# Patient Record
Sex: Female | Born: 1970 | Race: White | Hispanic: No | Marital: Married | Smoking: Former smoker
Health system: Southern US, Community
[De-identification: ages and names within clinical notes are randomized; demographics above are authoritative.]

## PROBLEM LIST (undated history)

## (undated) DIAGNOSIS — E119 Type 2 diabetes mellitus without complications: Secondary | ICD-10-CM

## (undated) DIAGNOSIS — D0511 Intraductal carcinoma in situ of right breast: Secondary | ICD-10-CM

## (undated) DIAGNOSIS — I10 Essential (primary) hypertension: Secondary | ICD-10-CM

## (undated) DIAGNOSIS — G5603 Carpal tunnel syndrome, bilateral upper limbs: Secondary | ICD-10-CM

## (undated) DIAGNOSIS — N200 Calculus of kidney: Secondary | ICD-10-CM

## (undated) DIAGNOSIS — F419 Anxiety disorder, unspecified: Secondary | ICD-10-CM

## (undated) DIAGNOSIS — R202 Paresthesia of skin: Secondary | ICD-10-CM

## (undated) DIAGNOSIS — H6120 Impacted cerumen, unspecified ear: Secondary | ICD-10-CM

## (undated) DIAGNOSIS — R7303 Prediabetes: Secondary | ICD-10-CM

## (undated) DIAGNOSIS — N189 Chronic kidney disease, unspecified: Secondary | ICD-10-CM

## (undated) DIAGNOSIS — E538 Deficiency of other specified B group vitamins: Secondary | ICD-10-CM

## (undated) DIAGNOSIS — L301 Dyshidrosis [pompholyx]: Secondary | ICD-10-CM

## (undated) DIAGNOSIS — E785 Hyperlipidemia, unspecified: Secondary | ICD-10-CM

## (undated) DIAGNOSIS — Z87442 Personal history of urinary calculi: Secondary | ICD-10-CM

## (undated) DIAGNOSIS — E669 Obesity, unspecified: Secondary | ICD-10-CM

## (undated) DIAGNOSIS — N2889 Other specified disorders of kidney and ureter: Secondary | ICD-10-CM

## (undated) HISTORY — DX: Carpal tunnel syndrome, bilateral upper limbs: G56.03

## (undated) HISTORY — DX: Prediabetes: R73.03

## (undated) HISTORY — DX: Dyshidrosis (pompholyx): L30.1

## (undated) HISTORY — PX: KIDNEY STONE SURGERY: SHX686

## (undated) HISTORY — DX: Impacted cerumen, unspecified ear: H61.20

## (undated) HISTORY — DX: Deficiency of other specified B group vitamins: E53.8

## (undated) HISTORY — DX: Paresthesia of skin: R20.2

---

## 2010-04-15 ENCOUNTER — Ambulatory Visit: Payer: Self-pay

## 2010-04-27 ENCOUNTER — Ambulatory Visit: Payer: Self-pay

## 2012-10-30 DIAGNOSIS — R7303 Prediabetes: Secondary | ICD-10-CM

## 2012-10-30 HISTORY — DX: Prediabetes: R73.03

## 2012-11-15 DIAGNOSIS — I10 Essential (primary) hypertension: Secondary | ICD-10-CM | POA: Insufficient documentation

## 2012-11-15 DIAGNOSIS — H6120 Impacted cerumen, unspecified ear: Secondary | ICD-10-CM

## 2012-11-15 HISTORY — DX: Impacted cerumen, unspecified ear: H61.20

## 2013-05-23 DIAGNOSIS — R300 Dysuria: Secondary | ICD-10-CM | POA: Insufficient documentation

## 2013-05-23 DIAGNOSIS — R399 Unspecified symptoms and signs involving the genitourinary system: Secondary | ICD-10-CM | POA: Insufficient documentation

## 2014-04-19 DIAGNOSIS — G5603 Carpal tunnel syndrome, bilateral upper limbs: Secondary | ICD-10-CM

## 2014-04-19 DIAGNOSIS — L301 Dyshidrosis [pompholyx]: Secondary | ICD-10-CM

## 2014-04-19 DIAGNOSIS — R202 Paresthesia of skin: Secondary | ICD-10-CM | POA: Insufficient documentation

## 2014-04-19 HISTORY — DX: Carpal tunnel syndrome, bilateral upper limbs: G56.03

## 2014-04-19 HISTORY — DX: Paresthesia of skin: R20.2

## 2014-04-19 HISTORY — DX: Dyshidrosis (pompholyx): L30.1

## 2014-04-22 DIAGNOSIS — E538 Deficiency of other specified B group vitamins: Secondary | ICD-10-CM | POA: Insufficient documentation

## 2014-04-22 HISTORY — DX: Deficiency of other specified B group vitamins: E53.8

## 2014-11-22 DIAGNOSIS — E7800 Pure hypercholesterolemia, unspecified: Secondary | ICD-10-CM | POA: Insufficient documentation

## 2015-12-28 DIAGNOSIS — N201 Calculus of ureter: Secondary | ICD-10-CM | POA: Insufficient documentation

## 2015-12-30 DIAGNOSIS — Z6841 Body Mass Index (BMI) 40.0 and over, adult: Secondary | ICD-10-CM

## 2016-02-25 ENCOUNTER — Other Ambulatory Visit: Payer: Self-pay | Admitting: Obstetrics and Gynecology

## 2016-02-25 DIAGNOSIS — Z1231 Encounter for screening mammogram for malignant neoplasm of breast: Secondary | ICD-10-CM

## 2016-03-18 ENCOUNTER — Encounter: Payer: Self-pay | Admitting: Radiology

## 2016-03-18 ENCOUNTER — Ambulatory Visit
Admission: RE | Admit: 2016-03-18 | Discharge: 2016-03-18 | Disposition: A | Discharge status: Home / Self Care | Payer: Managed Care, Other (non HMO) | Source: Ambulatory Visit | Attending: Obstetrics and Gynecology | Admitting: Obstetrics and Gynecology

## 2016-03-18 DIAGNOSIS — Z1231 Encounter for screening mammogram for malignant neoplasm of breast: Secondary | ICD-10-CM | POA: Insufficient documentation

## 2016-04-06 ENCOUNTER — Encounter: Payer: Self-pay | Admitting: Obstetrics and Gynecology

## 2016-04-09 NOTE — Progress Notes (Signed)
Letter mailed

## 2016-11-23 ENCOUNTER — Other Ambulatory Visit: Payer: Self-pay

## 2016-11-23 ENCOUNTER — Inpatient Hospital Stay
Admission: EM | Admit: 2016-11-23 | Discharge: 2016-11-25 | DRG: 339 | Disposition: A | Discharge status: Home / Self Care | Payer: 59 | Attending: Surgery | Admitting: Surgery

## 2016-11-23 ENCOUNTER — Emergency Department: Payer: 59

## 2016-11-23 ENCOUNTER — Emergency Department: Payer: 59 | Admitting: Certified Registered Nurse Anesthetist

## 2016-11-23 ENCOUNTER — Encounter
Admission: EM | Disposition: A | Discharge status: Home / Self Care | Payer: Self-pay | Source: Home / Self Care | Attending: Surgery

## 2016-11-23 ENCOUNTER — Encounter: Payer: Self-pay | Admitting: Emergency Medicine

## 2016-11-23 DIAGNOSIS — Z6839 Body mass index (BMI) 39.0-39.9, adult: Secondary | ICD-10-CM | POA: Diagnosis not present

## 2016-11-23 DIAGNOSIS — Z882 Allergy status to sulfonamides status: Secondary | ICD-10-CM

## 2016-11-23 DIAGNOSIS — K3532 Acute appendicitis with perforation and localized peritonitis, without abscess: Secondary | ICD-10-CM

## 2016-11-23 DIAGNOSIS — I10 Essential (primary) hypertension: Secondary | ICD-10-CM | POA: Diagnosis present

## 2016-11-23 DIAGNOSIS — N3001 Acute cystitis with hematuria: Secondary | ICD-10-CM | POA: Diagnosis present

## 2016-11-23 DIAGNOSIS — K353 Acute appendicitis with localized peritonitis, without perforation or gangrene: Secondary | ICD-10-CM

## 2016-11-23 DIAGNOSIS — Z87442 Personal history of urinary calculi: Secondary | ICD-10-CM

## 2016-11-23 HISTORY — PX: LAPAROSCOPIC APPENDECTOMY: SHX408

## 2016-11-23 HISTORY — DX: Calculus of kidney: N20.0

## 2016-11-23 HISTORY — DX: Essential (primary) hypertension: I10

## 2016-11-23 HISTORY — DX: Acute appendicitis with perforation, localized peritonitis, and gangrene, without abscess: K35.32

## 2016-11-23 HISTORY — PX: APPENDECTOMY: SHX54

## 2016-11-23 LAB — URINALYSIS, COMPLETE (UACMP) WITH MICROSCOPIC
Bilirubin Urine: NEGATIVE
GLUCOSE, UA: NEGATIVE mg/dL
KETONES UR: 5 mg/dL — AB
Nitrite: POSITIVE — AB
PH: 5 (ref 5.0–8.0)
Protein, ur: 30 mg/dL — AB
SPECIFIC GRAVITY, URINE: 1.019 (ref 1.005–1.030)

## 2016-11-23 LAB — COMPREHENSIVE METABOLIC PANEL
ALK PHOS: 98 U/L (ref 38–126)
ALT: 25 U/L (ref 14–54)
AST: 21 U/L (ref 15–41)
Albumin: 4.5 g/dL (ref 3.5–5.0)
Anion gap: 12 (ref 5–15)
BUN: 19 mg/dL (ref 6–20)
CALCIUM: 9.3 mg/dL (ref 8.9–10.3)
CO2: 24 mmol/L (ref 22–32)
CREATININE: 0.71 mg/dL (ref 0.44–1.00)
Chloride: 101 mmol/L (ref 101–111)
Glucose, Bld: 117 mg/dL — ABNORMAL HIGH (ref 65–99)
Potassium: 3.7 mmol/L (ref 3.5–5.1)
Sodium: 137 mmol/L (ref 135–145)
Total Bilirubin: 1.1 mg/dL (ref 0.3–1.2)
Total Protein: 8 g/dL (ref 6.5–8.1)

## 2016-11-23 LAB — LIPASE, BLOOD: Lipase: 20 U/L (ref 11–51)

## 2016-11-23 LAB — CBC
HCT: 41.3 % (ref 35.0–47.0)
Hemoglobin: 13.9 g/dL (ref 12.0–16.0)
MCH: 27.2 pg (ref 26.0–34.0)
MCHC: 33.7 g/dL (ref 32.0–36.0)
MCV: 80.9 fL (ref 80.0–100.0)
PLATELETS: 240 10*3/uL (ref 150–440)
RBC: 5.1 MIL/uL (ref 3.80–5.20)
RDW: 14.9 % — AB (ref 11.5–14.5)
WBC: 12.3 10*3/uL — AB (ref 3.6–11.0)

## 2016-11-23 LAB — PREGNANCY, URINE: Preg Test, Ur: NEGATIVE

## 2016-11-23 SURGERY — APPENDECTOMY, LAPAROSCOPIC
Anesthesia: General

## 2016-11-23 MED ORDER — ENOXAPARIN SODIUM 40 MG/0.4ML ~~LOC~~ SOLN
40.0000 mg | SUBCUTANEOUS | Status: DC
  Administered 2016-11-24: 40 mg via SUBCUTANEOUS
  Filled 2016-11-23: qty 0.4

## 2016-11-23 MED ORDER — CEFTRIAXONE SODIUM IN DEXTROSE 20 MG/ML IV SOLN
1.0000 g | Freq: Once | INTRAVENOUS | Status: AC
  Administered 2016-11-23: 1 g via INTRAVENOUS
  Filled 2016-11-23: qty 50

## 2016-11-23 MED ORDER — FENTANYL CITRATE (PF) 100 MCG/2ML IJ SOLN
INTRAMUSCULAR | Status: DC | PRN
  Administered 2016-11-23: 25 ug via INTRAVENOUS
  Administered 2016-11-23: 50 ug via INTRAVENOUS
  Administered 2016-11-23: 25 ug via INTRAVENOUS

## 2016-11-23 MED ORDER — ONDANSETRON HCL 4 MG/2ML IJ SOLN
INTRAMUSCULAR | Status: DC | PRN
  Administered 2016-11-23: 4 mg via INTRAVENOUS

## 2016-11-23 MED ORDER — ACETAMINOPHEN 325 MG PO TABS: 650.0000 mg | ORAL_TABLET | Freq: Four times a day (QID) | ORAL | Status: DC | PRN

## 2016-11-23 MED ORDER — DEXTROSE 5 % IV SOLN: 1.0000 g | Freq: Once | INTRAVENOUS | Status: DC

## 2016-11-23 MED ORDER — SUGAMMADEX SODIUM 200 MG/2ML IV SOLN
INTRAVENOUS | Status: DC | PRN
  Administered 2016-11-23: 200 mg via INTRAVENOUS

## 2016-11-23 MED ORDER — SODIUM CHLORIDE 0.9 % IV BOLUS (SEPSIS)
1000.0000 mL | Freq: Once | INTRAVENOUS | Status: AC
  Administered 2016-11-23: 1000 mL via INTRAVENOUS

## 2016-11-23 MED ORDER — LIDOCAINE HCL (CARDIAC) 20 MG/ML IV SOLN: INTRAVENOUS | Status: DC | PRN

## 2016-11-23 MED ORDER — OXYCODONE-ACETAMINOPHEN 5-325 MG PO TABS
1.0000 | ORAL_TABLET | ORAL | Status: DC | PRN
  Administered 2016-11-24 (×2): 2 via ORAL
  Filled 2016-11-23 (×2): qty 2

## 2016-11-23 MED ORDER — METRONIDAZOLE IN NACL 5-0.79 MG/ML-% IV SOLN
500.0000 mg | Freq: Three times a day (TID) | INTRAVENOUS | Status: DC
  Administered 2016-11-24 – 2016-11-25 (×4): 500 mg via INTRAVENOUS
  Filled 2016-11-23 (×7): qty 100

## 2016-11-23 MED ORDER — ACETAMINOPHEN 650 MG RE SUPP: 650.0000 mg | Freq: Four times a day (QID) | RECTAL | Status: DC | PRN

## 2016-11-23 MED ORDER — PHENYLEPHRINE HCL 10 MG/ML IJ SOLN
INTRAMUSCULAR | Status: DC | PRN
  Administered 2016-11-23 (×5): 100 ug via INTRAVENOUS

## 2016-11-23 MED ORDER — ONDANSETRON HCL 4 MG/2ML IJ SOLN: 4.0000 mg | Freq: Four times a day (QID) | INTRAMUSCULAR | Status: DC | PRN

## 2016-11-23 MED ORDER — PROPOFOL 10 MG/ML IV BOLUS
INTRAVENOUS | Status: DC | PRN
  Administered 2016-11-23: 200 mg via INTRAVENOUS

## 2016-11-23 MED ORDER — METRONIDAZOLE IN NACL 5-0.79 MG/ML-% IV SOLN
500.0000 mg | Freq: Once | INTRAVENOUS | Status: AC
  Administered 2016-11-23: 500 mg via INTRAVENOUS
  Filled 2016-11-23: qty 100

## 2016-11-23 MED ORDER — PROMETHAZINE HCL 25 MG/ML IJ SOLN
INTRAMUSCULAR | Status: AC
  Filled 2016-11-23: qty 1

## 2016-11-23 MED ORDER — MORPHINE SULFATE (PF) 2 MG/ML IV SOLN
2.0000 mg | INTRAVENOUS | Status: DC | PRN
  Administered 2016-11-24 (×3): 2 mg via INTRAVENOUS
  Filled 2016-11-23 (×3): qty 1

## 2016-11-23 MED ORDER — DEXAMETHASONE SODIUM PHOSPHATE 10 MG/ML IJ SOLN
INTRAMUSCULAR | Status: DC | PRN
  Administered 2016-11-23: 8 mg via INTRAVENOUS

## 2016-11-23 MED ORDER — ACETAMINOPHEN 10 MG/ML IV SOLN
INTRAVENOUS | Status: DC | PRN
  Administered 2016-11-23: 1000 mg via INTRAVENOUS

## 2016-11-23 MED ORDER — MORPHINE SULFATE (PF) 4 MG/ML IV SOLN
INTRAVENOUS | Status: AC
  Administered 2016-11-23: 4 mg via INTRAVENOUS
  Filled 2016-11-23: qty 1

## 2016-11-23 MED ORDER — CEFTRIAXONE SODIUM IN DEXTROSE 20 MG/ML IV SOLN
INTRAVENOUS | Status: AC
  Filled 2016-11-23: qty 50

## 2016-11-23 MED ORDER — LACTATED RINGERS IV SOLN
INTRAVENOUS | Status: DC
  Administered 2016-11-23 – 2016-11-25 (×4): via INTRAVENOUS

## 2016-11-23 MED ORDER — LIDOCAINE HCL 1 % IJ SOLN
INTRAMUSCULAR | Status: DC | PRN
  Administered 2016-11-23: 20 mL via INTRAMUSCULAR

## 2016-11-23 MED ORDER — DEXTROSE 5 % IV SOLN
INTRAVENOUS | Status: DC | PRN
  Administered 2016-11-23: 1 g via INTRAVENOUS

## 2016-11-23 MED ORDER — LIDOCAINE HCL (CARDIAC) 20 MG/ML IV SOLN
INTRAVENOUS | Status: DC | PRN
  Administered 2016-11-23: 100 mg via INTRAVENOUS

## 2016-11-23 MED ORDER — HEPARIN SODIUM (PORCINE) 5000 UNIT/ML IJ SOLN
5000.0000 [IU] | Freq: Three times a day (TID) | INTRAMUSCULAR | Status: DC
  Filled 2016-11-23 (×2): qty 1

## 2016-11-23 MED ORDER — ONDANSETRON 4 MG PO TBDP: 4.0000 mg | ORAL_TABLET | Freq: Four times a day (QID) | ORAL | Status: DC | PRN

## 2016-11-23 MED ORDER — DEXTROSE 5 % IV SOLN
2.0000 g | INTRAVENOUS | Status: DC
  Administered 2016-11-24: 2 g via INTRAVENOUS
  Filled 2016-11-23 (×2): qty 2

## 2016-11-23 MED ORDER — SODIUM CHLORIDE 0.9 % IV SOLN
INTRAVENOUS | Status: DC | PRN
  Administered 2016-11-23 (×2): via INTRAVENOUS

## 2016-11-23 MED ORDER — IOPAMIDOL (ISOVUE-300) INJECTION 61%
100.0000 mL | Freq: Once | INTRAVENOUS | Status: AC | PRN
  Administered 2016-11-23: 100 mL via INTRAVENOUS
  Filled 2016-11-23: qty 100

## 2016-11-23 MED ORDER — PROMETHAZINE HCL 25 MG/ML IJ SOLN: 6.2500 mg | INTRAMUSCULAR | Status: DC | PRN

## 2016-11-23 MED ORDER — PROMETHAZINE HCL 25 MG/ML IJ SOLN
12.5000 mg | Freq: Four times a day (QID) | INTRAMUSCULAR | Status: DC | PRN
  Administered 2016-11-23: 12.5 mg via INTRAVENOUS

## 2016-11-23 MED ORDER — SUCCINYLCHOLINE CHLORIDE 20 MG/ML IJ SOLN
INTRAMUSCULAR | Status: DC | PRN
  Administered 2016-11-23: 110 mg via INTRAVENOUS

## 2016-11-23 MED ORDER — KETOROLAC TROMETHAMINE 30 MG/ML IJ SOLN
30.0000 mg | Freq: Four times a day (QID) | INTRAMUSCULAR | Status: DC
  Administered 2016-11-24 – 2016-11-25 (×5): 30 mg via INTRAVENOUS
  Filled 2016-11-23 (×5): qty 1

## 2016-11-23 MED ORDER — ROCURONIUM BROMIDE 100 MG/10ML IV SOLN
INTRAVENOUS | Status: DC | PRN
  Administered 2016-11-23: 20 mg via INTRAVENOUS
  Administered 2016-11-23: 60 mg via INTRAVENOUS

## 2016-11-23 MED ORDER — MORPHINE SULFATE (PF) 4 MG/ML IV SOLN
4.0000 mg | INTRAVENOUS | Status: DC | PRN
  Administered 2016-11-23 (×2): 4 mg via INTRAVENOUS
  Filled 2016-11-23: qty 1

## 2016-11-23 MED ORDER — FENTANYL CITRATE (PF) 100 MCG/2ML IJ SOLN: 25.0000 ug | INTRAMUSCULAR | Status: DC | PRN

## 2016-11-23 MED ORDER — FENTANYL CITRATE (PF) 100 MCG/2ML IJ SOLN
50.0000 ug | INTRAMUSCULAR | Status: DC | PRN
  Administered 2016-11-23: 50 ug via INTRAVENOUS
  Filled 2016-11-23: qty 2

## 2016-11-23 SURGICAL SUPPLY — 44 items
BLADE SURG SZ11 CARB STEEL (BLADE) ×3 IMPLANT
BULB RESERV EVAC DRAIN JP 100C (MISCELLANEOUS) IMPLANT
CANISTER SUCT 3000ML PPV (MISCELLANEOUS) ×3 IMPLANT
CHLORAPREP W/TINT 26ML (MISCELLANEOUS) ×3 IMPLANT
CUTTER FLEX LINEAR 45M (STAPLE) ×3 IMPLANT
DECANTER SPIKE VIAL GLASS SM (MISCELLANEOUS) ×3 IMPLANT
DERMABOND ADVANCED (GAUZE/BANDAGES/DRESSINGS) ×2
DERMABOND ADVANCED .7 DNX12 (GAUZE/BANDAGES/DRESSINGS) ×1 IMPLANT
DRAIN CHANNEL JP 19F (MISCELLANEOUS) IMPLANT
ELECT REM PT RETURN 9FT ADLT (ELECTROSURGICAL) ×3
ELECTRODE REM PT RTRN 9FT ADLT (ELECTROSURGICAL) ×1 IMPLANT
GLOVE BIO SURGEON STRL SZ7 (GLOVE) ×6 IMPLANT
GLOVE BIOGEL PI IND STRL 7.5 (GLOVE) ×2 IMPLANT
GLOVE BIOGEL PI INDICATOR 7.5 (GLOVE) ×4
GOWN STRL REUS W/ TWL LRG LVL4 (GOWN DISPOSABLE) ×2 IMPLANT
GOWN STRL REUS W/ TWL XL LVL3 (GOWN DISPOSABLE) IMPLANT
GOWN STRL REUS W/TWL LRG LVL4 (GOWN DISPOSABLE) ×4
GOWN STRL REUS W/TWL XL LVL3 (GOWN DISPOSABLE)
GRASPER SUT TROCAR 14GX15 (MISCELLANEOUS) ×3 IMPLANT
IRRIGATION STRYKERFLOW (MISCELLANEOUS) IMPLANT
IRRIGATOR STRYKERFLOW (MISCELLANEOUS)
KIT RM TURNOVER STRD PROC AR (KITS) ×3 IMPLANT
NEEDLE HYPO 22GX1.5 SAFETY (NEEDLE) ×3 IMPLANT
NEEDLE INSUFFLATION 14GA 120MM (NEEDLE) ×3 IMPLANT
NS IRRIG 1000ML POUR BTL (IV SOLUTION) ×3 IMPLANT
PACK LAP CHOLECYSTECTOMY (MISCELLANEOUS) ×3 IMPLANT
POUCH SPECIMEN RETRIEVAL 10MM (ENDOMECHANICALS) ×3 IMPLANT
RELOAD 45 VASCULAR/THIN (ENDOMECHANICALS) IMPLANT
RELOAD STAPLE TA45 3.5 REG BLU (ENDOMECHANICALS) ×3 IMPLANT
SHEARS HARMONIC ACE PLUS 36CM (ENDOMECHANICALS) ×3 IMPLANT
SLEEVE ENDOPATH XCEL 5M (ENDOMECHANICALS) ×3 IMPLANT
SOL .9 NS 3000ML IRR  AL (IV SOLUTION)
SOL .9 NS 3000ML IRR UROMATIC (IV SOLUTION) IMPLANT
SPONGE DRAIN TRACH 4X4 STRL 2S (GAUZE/BANDAGES/DRESSINGS) IMPLANT
SUT ETHILON 3-0 FS-10 30 BLK (SUTURE) ×3
SUT MNCRL AB 4-0 PS2 18 (SUTURE) ×3 IMPLANT
SUT VICRYL 0 UR6 27IN ABS (SUTURE) ×3 IMPLANT
SUT VICRYL AB 3-0 FS1 BRD 27IN (SUTURE) ×3 IMPLANT
SUTURE EHLN 3-0 FS-10 30 BLK (SUTURE) ×1 IMPLANT
SWAB CULTURE AMIES ANAERIB BLU (MISCELLANEOUS) ×3 IMPLANT
TRAY FOLEY W/METER SILVER 16FR (SET/KITS/TRAYS/PACK) ×3 IMPLANT
TROCAR XCEL 12X100 BLDLESS (ENDOMECHANICALS) ×3 IMPLANT
TROCAR XCEL NON-BLD 5MMX100MML (ENDOMECHANICALS) ×3 IMPLANT
TUBING INSUFFLATION (TUBING) ×3 IMPLANT

## 2016-11-23 NOTE — ED Notes (Signed)
Pt to ed with c/o right side abd pain that started last night, reports severe constant pain since starting, also reports nausea, denies vomiting. Pain now 10/10.

## 2016-11-23 NOTE — ED Notes (Signed)
Lab called to run urine preg - they stated they had over looked the order and would run it now

## 2016-11-23 NOTE — H&P (Signed)
Sherri Forbes is an 46 y.o. female.    Chief Complaint: Right lower quadrant pain  HPI: This a patient with right lower quadrant pain he came on gradually starting yesterday at noon. She states that she's had chills at home but took her temperature at home and was only 98. She's never had an episode like this before. She points to an area to the right lateral lower quadrant. She's had some nausea but no emesis and she ate breakfast this morning. Nothing since. She denies melena or hematochezia and no hematuria although she has had a history of kidney stones in the past requiring left nephrolithotomy.  I was asked see the patient by Dr. Alford Highland for a diagnosis of acute appendicitis seen on CT scan.  Past Medical History:  Diagnosis Date  . Hypertension   . Kidney stone     Past Surgical History:  Procedure Laterality Date  . KIDNEY STONE SURGERY      Family History  Problem Relation Age of Onset  . Breast cancer Maternal Grandmother    Social History:  reports that  has never smoked. she has never used smokeless tobacco. She reports that she drinks alcohol. She reports that she does not use drugs.  Allergies:  Allergies  Allergen Reactions  . Penicillins Other (See Comments)  . Sulfa Antibiotics Hives     (Not in a hospital admission)   Review of Systems  Constitutional: Negative for chills, fever and weight loss.  HENT: Negative.   Eyes: Negative.   Respiratory: Negative.   Cardiovascular: Negative.   Gastrointestinal: Positive for abdominal pain and nausea. Negative for blood in stool, constipation, diarrhea, heartburn, melena and vomiting.  Genitourinary: Negative.   Musculoskeletal: Negative.   Skin: Negative.   Neurological: Negative.   Endo/Heme/Allergies: Negative.   Psychiatric/Behavioral: Negative.      Physical Exam:  BP (!) 145/95   Pulse (!) 107   Temp 98.2 F (36.8 C)   Resp 20   Ht 5' 4"  (1.626 m)   Wt 228 lb (103.4 kg)   LMP 07/07/2016    SpO2 96%   BMI 39.14 kg/m   Physical Exam  Constitutional: She is oriented to person, place, and time and well-developed, well-nourished, and in no distress. No distress.  Obese female patient appears to be uncomfortable. She appears to prefer to lay flat and still  HENT:  Head: Normocephalic and atraumatic.  Eyes: Pupils are equal, round, and reactive to light. Right eye exhibits no discharge. Left eye exhibits no discharge. No scleral icterus.  Neck: Normal range of motion. No JVD present.  Cardiovascular: Normal rate, regular rhythm and normal heart sounds.  Pulmonary/Chest: Effort normal. No respiratory distress. She has no wheezes. She has no rales.  Abdominal: Soft. She exhibits no distension. There is tenderness. There is guarding. There is no rebound.  Tenderness in the right lower quadrant somewhat lateral to McBurney's point with positive Rovsing sign  Musculoskeletal: Normal range of motion. She exhibits no edema or tenderness.  Lymphadenopathy:    She has no cervical adenopathy.  Neurological: She is alert and oriented to person, place, and time.  Skin: Skin is warm and dry. No rash noted. She is not diaphoretic. No erythema.  Tattoos  Psychiatric: Mood and affect normal.  Vitals reviewed.       Results for orders placed or performed during the hospital encounter of 11/23/16 (from the past 48 hour(s))  Lipase, blood     Status: None   Collection Time:  11/23/16  1:07 PM  Result Value Ref Range   Lipase 20 11 - 51 U/L  Comprehensive metabolic panel     Status: Abnormal   Collection Time: 11/23/16  1:07 PM  Result Value Ref Range   Sodium 137 135 - 145 mmol/L   Potassium 3.7 3.5 - 5.1 mmol/L   Chloride 101 101 - 111 mmol/L   CO2 24 22 - 32 mmol/L   Glucose, Bld 117 (H) 65 - 99 mg/dL   BUN 19 6 - 20 mg/dL   Creatinine, Ser 0.71 0.44 - 1.00 mg/dL   Calcium 9.3 8.9 - 10.3 mg/dL   Total Protein 8.0 6.5 - 8.1 g/dL   Albumin 4.5 3.5 - 5.0 g/dL   AST 21 15 - 41 U/L    ALT 25 14 - 54 U/L   Alkaline Phosphatase 98 38 - 126 U/L   Total Bilirubin 1.1 0.3 - 1.2 mg/dL   GFR calc non Af Amer >60 >60 mL/min   GFR calc Af Amer >60 >60 mL/min    Comment: (NOTE) The eGFR has been calculated using the CKD EPI equation. This calculation has not been validated in all clinical situations. eGFR's persistently <60 mL/min signify possible Chronic Kidney Disease.    Anion gap 12 5 - 15  CBC     Status: Abnormal   Collection Time: 11/23/16  1:07 PM  Result Value Ref Range   WBC 12.3 (H) 3.6 - 11.0 K/uL   RBC 5.10 3.80 - 5.20 MIL/uL   Hemoglobin 13.9 12.0 - 16.0 g/dL   HCT 41.3 35.0 - 47.0 %   MCV 80.9 80.0 - 100.0 fL   MCH 27.2 26.0 - 34.0 pg   MCHC 33.7 32.0 - 36.0 g/dL   RDW 14.9 (H) 11.5 - 14.5 %   Platelets 240 150 - 440 K/uL  Urinalysis, Complete w Microscopic     Status: Abnormal   Collection Time: 11/23/16  1:07 PM  Result Value Ref Range   Color, Urine YELLOW (A) YELLOW   APPearance HAZY (A) CLEAR   Specific Gravity, Urine 1.019 1.005 - 1.030   pH 5.0 5.0 - 8.0   Glucose, UA NEGATIVE NEGATIVE mg/dL   Hgb urine dipstick MODERATE (A) NEGATIVE   Bilirubin Urine NEGATIVE NEGATIVE   Ketones, ur 5 (A) NEGATIVE mg/dL   Protein, ur 30 (A) NEGATIVE mg/dL   Nitrite POSITIVE (A) NEGATIVE   Leukocytes, UA MODERATE (A) NEGATIVE   RBC / HPF 0-5 0 - 5 RBC/hpf   WBC, UA TOO NUMEROUS TO COUNT 0 - 5 WBC/hpf   Bacteria, UA MANY (A) NONE SEEN   Squamous Epithelial / LPF 0-5 (A) NONE SEEN   Mucus PRESENT   Pregnancy, urine     Status: None   Collection Time: 11/23/16  1:07 PM  Result Value Ref Range   Preg Test, Ur NEGATIVE NEGATIVE   Ct Abdomen Pelvis W Contrast  Addendum Date: 11/23/2016   ADDENDUM REPORT: 11/23/2016 17:50 ADDENDUM: These results were called by telephone at the time of interpretation on 11/23/2016 at 5:47 pm to Dr. Merlyn Lot , who verbally acknowledged these results. Electronically Signed   By: Fidela Salisbury M.D.   On: 11/23/2016  17:50   Result Date: 11/23/2016 CLINICAL DATA:  Right-sided abdominal pain, history of kidney stones. EXAM: CT ABDOMEN AND PELVIS WITH CONTRAST TECHNIQUE: Multidetector CT imaging of the abdomen and pelvis was performed using the standard protocol following bolus administration of intravenous contrast. CONTRAST:  155m ISOVUE-300 IOPAMIDOL (  ISOVUE-300) INJECTION 61% COMPARISON:  None. FINDINGS: Lower chest: Small pericardial effusion with maximum transverse diameter of 11 mm. Hepatobiliary: Mild hepatic steatosis. Normal appearance of the gallbladder. Pancreas: Unremarkable. No pancreatic ductal dilatation or surrounding inflammatory changes. Spleen: Normal in size without focal abnormality. Adrenals/Urinary Tract: No adrenal masses. Normal appearance of the right kidney. No evidence of hydronephrosis. Partially calcified left upper pole renal cortical scarring. Partially exophytic left lower pole benign-appearing cyst measuring 2.6 cm. Stomach/Bowel: Stomach is within normal limits. The appendix is thickened to 11 mm with diffuse mucosal edema and carie appendiceal fat stranding. Small amount of fluid is also present in the right pericolic gutter/right pelvis. Findings are consistent with acute appendicitis. Appendicolith is seen in the proximal appendix, image 44/111, sequence 4, coronal images. Micro rupture cannot be excluded. Organized abscess is not seen. No evidence of small-bowel obstruction. Colon has normal appearance. Vascular/Lymphatic: Aortic atherosclerosis. No enlarged abdominal or pelvic lymph nodes. Reproductive: Uterus and bilateral adnexa are unremarkable. Other: No abdominal wall hernia or abnormality. Musculoskeletal: No acute or significant osseous findings. IMPRESSION: Thickened appendix with marked periappendiceal inflammatory changes and small amount of free fluid. Findings consistent with acute appendicitis. Micro rupture cannot be excluded. No evidence of abscess formation. Likely  reactive mildly enlarged right lower quadrant mesenteric lymph nodes. Advanced for age atherosclerotic disease of the aorta. Calcified cortical scarring in the left kidney. Benign-appearing left renal cyst. Electronically Signed: By: Fidela Salisbury M.D. On: 11/23/2016 17:45     Assessment/Plan  Labs are personally reviewed there is an elevated white blood cell count. CT scan is personally reviewed showing appendicitis with some fluid around the area suggestive of microperforation.  This a patient with acute appendicitis. She believes her pain came on yesterday at noon and is gradually been worsening. Her workup has suggested acute appendicitis and it is consistent with her history and physical. I recommended a laparoscopic appendectomy this evening. IV antibiotics have arty been started. The rationale for this approach is been discussed as has the risk of bleeding infection recurrence of disease recurrence of infection including abscess if the appendix is already ruptured as it may be trans-locating at this point already. This is all reviewed for she and her family they understood and agreed to proceed.  Florene Glen, MD, FACS

## 2016-11-23 NOTE — Anesthesia Preprocedure Evaluation (Signed)
Anesthesia Evaluation  Patient identified by MRN, date of birth, ID band Patient awake    Reviewed: Allergy & Precautions, H&P , NPO status , Patient's Chart, lab work & pertinent test results, reviewed documented beta blocker date and time   History of Anesthesia Complications Negative for: history of anesthetic complications  Airway Mallampati: II  TM Distance: >3 FB Neck ROM: full    Dental  (+) Dental Advidsory Given, Missing, Teeth Intact, Caps   Pulmonary neg pulmonary ROS,           Cardiovascular Exercise Tolerance: Good hypertension, (-) angina(-) CAD, (-) Past MI, (-) Cardiac Stents and (-) CABG (-) dysrhythmias (-) Valvular Problems/Murmurs     Neuro/Psych negative neurological ROS  negative psych ROS   GI/Hepatic negative GI ROS, Neg liver ROS,   Endo/Other  neg diabetesMorbid obesity  Renal/GU Renal disease (kidney stones)  negative genitourinary   Musculoskeletal   Abdominal   Peds  Hematology negative hematology ROS (+)   Anesthesia Other Findings Past Medical History: No date: Hypertension No date: Kidney stone   Reproductive/Obstetrics negative OB ROS                             Anesthesia Physical Anesthesia Plan  ASA: III  Anesthesia Plan: General   Post-op Pain Management:    Induction: Intravenous  PONV Risk Score and Plan: 3 and Ondansetron and Dexamethasone  Airway Management Planned: Oral ETT  Additional Equipment:   Intra-op Plan:   Post-operative Plan: Extubation in OR  Informed Consent: I have reviewed the patients History and Physical, chart, labs and discussed the procedure including the risks, benefits and alternatives for the proposed anesthesia with the patient or authorized representative who has indicated his/her understanding and acceptance.   Dental Advisory Given  Plan Discussed with: Anesthesiologist, CRNA and Surgeon  Anesthesia  Plan Comments:         Anesthesia Quick Evaluation

## 2016-11-23 NOTE — Anesthesia Post-op Follow-up Note (Signed)
Anesthesia QCDR form completed.        

## 2016-11-23 NOTE — ED Triage Notes (Signed)
Patient presents to ED via [facility] from home with c/o right sided abdominal pain. Hx of kidney stones. Patient guarding abdomen in triage and moaning in pain.

## 2016-11-23 NOTE — ED Notes (Signed)
First Nurse Note; pt in via EMS with complaints of RLQ abdominal pain since yesterday.  PIV established per EMS.

## 2016-11-23 NOTE — Transfer of Care (Signed)
Immediate Anesthesia Transfer of Care Note  Patient: Sherri Forbes  Procedure(s) Performed: APPENDECTOMY LAPAROSCOPIC (N/A )  Patient Location: PACU  Anesthesia Type:General  Level of Consciousness: awake, alert , oriented and patient cooperative  Airway & Oxygen Therapy: Patient Spontanous Breathing and Patient connected to face mask oxygen  Post-op Assessment: Report given to RN, Post -op Vital signs reviewed and stable and Patient moving all extremities  Post vital signs: Reviewed and stable  Last Vitals:  Vitals:   11/23/16 1630 11/23/16 1851  BP: (!) 142/74 (!) 150/68  Pulse: 82 80  Resp: 16 16  Temp:  37.7 C  SpO2: 100% 100%    Last Pain:  Vitals:   11/23/16 1851  TempSrc: Oral  PainSc: 10-Worst pain ever         Complications: No apparent anesthesia complications

## 2016-11-23 NOTE — ED Notes (Signed)
Report given to the OR and consent obtained on line

## 2016-11-23 NOTE — Op Note (Addendum)
SURGICAL OPERATIVE REPORT  DATE OF PROCEDURE: 11/23/2016  ATTENDING Surgeon(s): Vickie Epley, MD   ANESTHESIA: GETA (General)  PRE-OPERATIVE DIAGNOSIS: Acute appendicitis with localized peritonitis (K35.30)  POST-OPERATIVE DIAGNOSIS: Acute perforated appendicitis with localized peritonitis (K35.32)  PROCEDURE(S):  1.) Laparoscopic appendectomy (cpt: 35361)  INTRAOPERATIVE FINDINGS: Severely inflamed appendix surrounded by murky ascites with small perforation and visible appendiceal fecolith without appreciable fecal or purulent spillage  INTRAVENOUS FLUIDS: 1200 mL crystalloid   ESTIMATED BLOOD LOSS: Minimal (<20 mL)  URINE OUTPUT: 150 mL   SPECIMENS: Appendix  IMPLANTS: None  DRAINS: None  COMPLICATIONS: None apparent  CONDITION AT END OF PROCEDURE: Hemodynamically stable and extubated  DISPOSITION OF PATIENT: PACU  INDICATIONS FOR PROCEDURE:  Patient is a 46 y.o. morbidly obese female who presented with acute onset of non-specific generalized abdominal pain x 24 hours that progressed to become greatest at the Right lower quadrant with nausea and subjective chills without emesis, fever, change in bowel habits, CP, or SOB and reported her pain was somewhat improved while in the Emergency Department. All risks, benefits, and alternatives to appendectomy were discussed with the patient, all of patient's questions were answered to her expressed satisfaction, and informed consent was obtained and documented.  DETAILS OF PROCEDURE: Patient was brought to the operating suite and appropriately identified. General anesthesia was administered along with confirmation and administration of appropriate pre-operative antibiotics, and endotracheal intubation was performed by anesthetist, along with NG/OG tube for gastric decompression. In supine position, operative site was prepped and draped in usual sterile fashion, and following a brief time out, initial 5 mm incision was made in  a natural skin crease just below the umbilicus. Fascia was then elevated, and a Verress needle was inserted and its proper position confirmed using saline meniscus test prior to abdominal insufflation.  Upon insufflation of the abdominal cavity with carbon dioxide to a well-tolerated pressure of 12-15 mmHg, a 5 mm peri-umbilical port followed by laparoscope were inserted and used to inspect the abdominal cavity and its contents with no injuries from insertion of the first trochar noted. Two additional trocars were inserted, a 12 mm port at the Left lower quadrant position and another 5 mm port at the suprapubic position. The table was then placed in Trendelenburg position with the Right side up, and blunt graspers were gently used to retract the bowel, divide lateral peritoneal attachments from the cecum to reveal a clearly very inflamed retrocecal appendix with the most severe inflammation surrounding the mid-appendix surrounded by severe peri-appendiceal inflammation and murky non-purulent and non-feculent ascites. The appendix was gently retracted by near its tip, and the base of the appendix and mesoappendix were identified in relation to the cecum. While the base of the appendix was fairly normal and non-inflamed, an appendiceal fecolith was visible through a small appendiceal perforation 1 - 1.5 cm from the appendiceal-cecal junction. The mesoappendix was dissected from the visceral appendix and hemostasis achieved using a harmonic scalpel. Upon freeing the visceral appendix from the mesoappendix, an endostapler loaded with a standard blue tissue load was advanced across the base of the visceral appendix, which was compressed for several seconds, and the stapler was deployed and removed from the abdominal cavity. Hemostasis was confirmed, and the specimen was extracted from the abdominal cavity in a laparoscopic specimen bag.  The intraperitoneal cavity was inspected with no additional findings, and  copious saline irrigation was instilled and suctioned until clear. PMI laparoscopic fascial closure device was then used to re-approximate fascia at the 12  mm Left lower quadrant port site. All ports were then removed under direct visualization, and the abdominal cavity was desuflated. All port sites were irrigated/cleaned, additional local anesthetic was injected at each incision, 3-0 Vicryl was used to re-approximate dermis at 10/12 mm port site(s), and subcuticular 4-0 Monocryl suture was used to re-approximate skin. Skin was then cleaned, dried, and sterile skin glue was applied. Patient was then safely able to be awakened, extubated, and transferred to PACU for post-operative monitoring and care.   I was present for all aspects of procedure, and there were no intra-operative complications apparent.

## 2016-11-23 NOTE — Anesthesia Procedure Notes (Signed)
Procedure Name: Intubation Date/Time: 11/23/2016 8:22 PM Performed by: Lowry Bowl, CRNA Pre-anesthesia Checklist: Patient identified, Emergency Drugs available, Suction available and Patient being monitored Patient Re-evaluated:Patient Re-evaluated prior to induction Oxygen Delivery Method: Circle system utilized Preoxygenation: Pre-oxygenation with 100% oxygen Induction Type: IV induction, Cricoid Pressure applied and Rapid sequence Ventilation: Mask ventilation without difficulty Laryngoscope Size: Mac and 3 Grade View: Grade II Tube type: Oral Tube size: 7.0 mm Number of attempts: 1 Airway Equipment and Method: Stylet Placement Confirmation: ETT inserted through vocal cords under direct vision,  positive ETCO2 and breath sounds checked- equal and bilateral Secured at: 22 cm Tube secured with: Tape Dental Injury: Teeth and Oropharynx as per pre-operative assessment

## 2016-11-23 NOTE — ED Provider Notes (Signed)
[facility] Emergency Department Provider Note    First MD Initiated Contact with Patient 11/23/16 1514     (approximate)  I have reviewed the triage vital signs and the nursing notes.   HISTORY  Chief Complaint Abdominal Pain    HPI Sherri Forbes is a 46 y.o. female resents with chief complaint of right lower quadrant and right-sided abdominal pain associated with nausea but no vomiting.  States the pain is moderate to severe and constant.  Has had chills.  No measured fevers.  Has had a history of kidney stones but this feels different.  Has had some change in urinary habits but denies any significant burning or pain with urination.  Denies any diarrhea or constipation.  No chest pain or shortness of breath.  Past Medical History:  Diagnosis Date  . Hypertension   . Kidney stone    Family History  Problem Relation Age of Onset  . Breast cancer Maternal Grandmother    Past Surgical History:  Procedure Laterality Date  . KIDNEY STONE SURGERY     There are no active problems to display for this patient.     Prior to Admission medications   Not on File    Allergies Penicillins and Sulfa antibiotics    Social History Social History   Tobacco Use  . Smoking status: Never Smoker  . Smokeless tobacco: Never Used  Substance Use Topics  . Alcohol use: Yes    Frequency: Never    Comment: Social  . Drug use: No    Review of Systems Patient denies headaches, rhinorrhea, blurry vision, numbness, shortness of breath, chest pain, edema, cough, abdominal pain, nausea, vomiting, diarrhea, dysuria, fevers, rashes or hallucinations unless otherwise stated above in HPI. ____________________________________________   PHYSICAL EXAM:  VITAL SIGNS: Vitals:   11/23/16 1306  BP: (!) 145/95  Pulse: (!) 107  Resp: 20  Temp: 98.2 F (36.8 C)  SpO2: 96%    Constitutional: Alert and oriented. ill appearing and in no acute distress. Eyes:  Conjunctivae are normal.  Head: Atraumatic. Nose: No congestion/rhinnorhea. Mouth/Throat: Mucous membranes are moist.   Neck: No stridor. Painless ROM.  Cardiovascular: Normal rate, regular rhythm. Grossly normal heart sounds.  Good peripheral circulation. Respiratory: Normal respiratory effort.  No retractions. Lungs CTAB. Gastrointestinal: Soft with +++ RLQ ttp with involuntary guarding. No distention. No abdominal bruits. No CVA tenderness. Genitourinary:  Musculoskeletal: No lower extremity tenderness nor edema.  No joint effusions. Neurologic:  Normal speech and language. No gross focal neurologic deficits are appreciated. No facial droop Skin:  Skin is warm, dry and intact. No rash noted. Psychiatric: Mood and affect are normal. Speech and behavior are normal.  ____________________________________________   LABS (all labs ordered are listed, but only abnormal results are displayed)  Results for orders placed or performed during the hospital encounter of 11/23/16 (from the past 24 hour(s))  Lipase, blood     Status: None   Collection Time: 11/23/16  1:07 PM  Result Value Ref Range   Lipase 20 11 - 51 U/L  Comprehensive metabolic panel     Status: Abnormal   Collection Time: 11/23/16  1:07 PM  Result Value Ref Range   Sodium 137 135 - 145 mmol/L   Potassium 3.7 3.5 - 5.1 mmol/L   Chloride 101 101 - 111 mmol/L   CO2 24 22 - 32 mmol/L   Glucose, Bld 117 (H) 65 - 99 mg/dL   BUN 19 6 - 20 mg/dL  Creatinine, Ser 0.71 0.44 - 1.00 mg/dL   Calcium 9.3 8.9 - 10.3 mg/dL   Total Protein 8.0 6.5 - 8.1 g/dL   Albumin 4.5 3.5 - 5.0 g/dL   AST 21 15 - 41 U/L   ALT 25 14 - 54 U/L   Alkaline Phosphatase 98 38 - 126 U/L   Total Bilirubin 1.1 0.3 - 1.2 mg/dL   GFR calc non Af Amer >60 >60 mL/min   GFR calc Af Amer >60 >60 mL/min   Anion gap 12 5 - 15  CBC     Status: Abnormal   Collection Time: 11/23/16  1:07 PM  Result Value Ref Range   WBC 12.3 (H) 3.6 - 11.0 K/uL   RBC 5.10 3.80  - 5.20 MIL/uL   Hemoglobin 13.9 12.0 - 16.0 g/dL   HCT 41.3 35.0 - 47.0 %   MCV 80.9 80.0 - 100.0 fL   MCH 27.2 26.0 - 34.0 pg   MCHC 33.7 32.0 - 36.0 g/dL   RDW 14.9 (H) 11.5 - 14.5 %   Platelets 240 150 - 440 K/uL  Urinalysis, Complete w Microscopic     Status: Abnormal   Collection Time: 11/23/16  1:07 PM  Result Value Ref Range   Color, Urine YELLOW (A) YELLOW   APPearance HAZY (A) CLEAR   Specific Gravity, Urine 1.019 1.005 - 1.030   pH 5.0 5.0 - 8.0   Glucose, UA NEGATIVE NEGATIVE mg/dL   Hgb urine dipstick MODERATE (A) NEGATIVE   Bilirubin Urine NEGATIVE NEGATIVE   Ketones, ur 5 (A) NEGATIVE mg/dL   Protein, ur 30 (A) NEGATIVE mg/dL   Nitrite POSITIVE (A) NEGATIVE   Leukocytes, UA MODERATE (A) NEGATIVE   RBC / HPF 0-5 0 - 5 RBC/hpf   WBC, UA TOO NUMEROUS TO COUNT 0 - 5 WBC/hpf   Bacteria, UA MANY (A) NONE SEEN   Squamous Epithelial / LPF 0-5 (A) NONE SEEN   Mucus PRESENT    ____________________________________________  ____________________________________________  RADIOLOGY  I personally reviewed all radiographic images ordered to evaluate for the above acute complaints and reviewed radiology reports and findings.  These findings were personally discussed with the patient.  Please see medical record for radiology report.  ____________________________________________   PROCEDURES  Procedure(s) performed:  Procedures    Critical Care performed: no ____________________________________________   INITIAL IMPRESSION / ASSESSMENT AND PLAN / ED COURSE  Pertinent labs & imaging results that were available during my care of the patient were reviewed by me and considered in my medical decision making (see chart for details).  DDX: appy, pyelo uti, abscess, diverticulitis, stone  Sherri Forbes is a 46 y.o. who presents to the ED with symptoms as described above.  Patient with fairly significant right lower quadrant tenderness to palpation with report of fevers  at home and evidence of urinary tract infection that seems out of proportion given her abdominal exam therefore CT imaging was ordered for the above differential and shows evidence of acute appendicitis with probable microperforation.  Patient was given Rocephin while awaiting CT imaging due to concern for pyelonephritis therefore after seeing results of acute appendicitis the patient was started on IV Flagyl.  She received 2 L of normal saline bolus for resuscitation.  Patient received IV pain medication.  She was made n.p.o.  I spoke with Dr. Burt Knack of general surgery who agrees to admit patient for further evaluation and management.      ____________________________________________   FINAL CLINICAL IMPRESSION(S) / ED  DIAGNOSES  Final diagnoses:  Acute appendicitis with localized peritonitis  Acute cystitis with hematuria      NEW MEDICATIONS STARTED DURING THIS VISIT:  This SmartLink is deprecated. Use AVSMEDLIST instead to display the medication list for a patient.   Note:  This document was prepared using Dragon voice recognition software and may include unintentional dictation errors.    Merlyn Lot, MD 11/23/16 [phone]

## 2016-11-24 ENCOUNTER — Encounter: Payer: Self-pay | Admitting: Surgery

## 2016-11-24 LAB — CBC
HCT: 38.1 % (ref 35.0–47.0)
Hemoglobin: 12.8 g/dL (ref 12.0–16.0)
MCH: 27.4 pg (ref 26.0–34.0)
MCHC: 33.5 g/dL (ref 32.0–36.0)
MCV: 81.6 fL (ref 80.0–100.0)
PLATELETS: 195 10*3/uL (ref 150–440)
RBC: 4.67 MIL/uL (ref 3.80–5.20)
RDW: 15.3 % — AB (ref 11.5–14.5)
WBC: 13.1 10*3/uL — ABNORMAL HIGH (ref 3.6–11.0)

## 2016-11-24 NOTE — Progress Notes (Signed)
1 Day Post-Op  Subjective: Patient feels well postop still was minimal pain.  Objective: Vital signs in last 24 hours: Temp:  [97.6 F (36.4 C)-100 F (37.8 C)] 97.6 F (36.4 C) (11/07 0438) Pulse Rate:  [80-112] 98 (11/07 0438) Resp:  [16-22] 19 (11/07 0438) BP: (97-150)/(59-95) 116/65 (11/07 0438) SpO2:  [91 %-100 %] 97 % (11/07 0438) Weight:  [228 lb (103.4 kg)] 228 lb (103.4 kg) (11/06 1306)    Intake/Output from previous day: 11/06 0701 - 11/07 0700 In: 3187 [P.O.:90; I.V.:2047; IV Piggyback:1050] Out: 805 [Urine:800; Blood:5] Intake/Output this shift: No intake/output data recorded.  Physical exam:  Vital signs stable abdomen soft nontender wounds are clean with Dermabond  Lab Results: CBC  Recent Labs    11/23/16 1307 11/24/16 0422  WBC 12.3* 13.1*  HGB 13.9 12.8  HCT 41.3 38.1  PLT 240 195   BMET Recent Labs    11/23/16 1307  NA 137  K 3.7  CL 101  CO2 24  GLUCOSE 117*  BUN 19  CREATININE 0.71  CALCIUM 9.3   PT/INR No results for input(s): LABPROT, INR in the last 72 hours. ABG No results for input(s): PHART, HCO3 in the last 72 hours.  Invalid input(s): PCO2, PO2  Studies/Results: Ct Abdomen Pelvis W Contrast  Addendum Date: 11/23/2016   ADDENDUM REPORT: 11/23/2016 17:50 ADDENDUM: These results were called by telephone at the time of interpretation on 11/23/2016 at 5:47 pm to Dr. Merlyn Lot , who verbally acknowledged these results. Electronically Signed   By: Fidela Salisbury M.D.   On: 11/23/2016 17:50   Result Date: 11/23/2016 CLINICAL DATA:  Right-sided abdominal pain, history of kidney stones. EXAM: CT ABDOMEN AND PELVIS WITH CONTRAST TECHNIQUE: Multidetector CT imaging of the abdomen and pelvis was performed using the standard protocol following bolus administration of intravenous contrast. CONTRAST:  150mL ISOVUE-300 IOPAMIDOL (ISOVUE-300) INJECTION 61% COMPARISON:  None. FINDINGS: Lower chest: Small pericardial effusion with  maximum transverse diameter of 11 mm. Hepatobiliary: Mild hepatic steatosis. Normal appearance of the gallbladder. Pancreas: Unremarkable. No pancreatic ductal dilatation or surrounding inflammatory changes. Spleen: Normal in size without focal abnormality. Adrenals/Urinary Tract: No adrenal masses. Normal appearance of the right kidney. No evidence of hydronephrosis. Partially calcified left upper pole renal cortical scarring. Partially exophytic left lower pole benign-appearing cyst measuring 2.6 cm. Stomach/Bowel: Stomach is within normal limits. The appendix is thickened to 11 mm with diffuse mucosal edema and carie appendiceal fat stranding. Small amount of fluid is also present in the right pericolic gutter/right pelvis. Findings are consistent with acute appendicitis. Appendicolith is seen in the proximal appendix, image 44/111, sequence 4, coronal images. Micro rupture cannot be excluded. Organized abscess is not seen. No evidence of small-bowel obstruction. Colon has normal appearance. Vascular/Lymphatic: Aortic atherosclerosis. No enlarged abdominal or pelvic lymph nodes. Reproductive: Uterus and bilateral adnexa are unremarkable. Other: No abdominal wall hernia or abnormality. Musculoskeletal: No acute or significant osseous findings. IMPRESSION: Thickened appendix with marked periappendiceal inflammatory changes and small amount of free fluid. Findings consistent with acute appendicitis. Micro rupture cannot be excluded. No evidence of abscess formation. Likely reactive mildly enlarged right lower quadrant mesenteric lymph nodes. Advanced for age atherosclerotic disease of the aorta. Calcified cortical scarring in the left kidney. Benign-appearing left renal cyst. Electronically Signed: By: Fidela Salisbury M.D. On: 11/23/2016 17:45    Anti-infectives: Anti-infectives (From admission, onward)   Start     Dose/Rate Route Frequency Ordered Stop   11/24/16 1600  cefTRIAXone (ROCEPHIN) 2 g  in  dextrose 5 % 50 mL IVPB     2 g 100 mL/hr over 30 Minutes Intravenous Every 24 hours 11/23/16 2307     11/24/16 0300  metroNIDAZOLE (FLAGYL) IVPB 500 mg     500 mg 100 mL/hr over 60 Minutes Intravenous Every 8 hours 11/23/16 2307     11/23/16 1950  cefTRIAXone (ROCEPHIN) 20 MG/ML IVPB 50 mL    Comments:  Galen Daft   : cabinet override      11/23/16 1950 11/24/16 0759   11/23/16 1800  metroNIDAZOLE (FLAGYL) IVPB 500 mg     500 mg 100 mL/hr over 60 Minutes Intravenous  Once 11/23/16 1749 11/23/16 1947   11/23/16 1600  cefTRIAXone (ROCEPHIN) 1 g in dextrose 5 % 50 mL IVPB - Premix     1 g 100 mL/hr over 30 Minutes Intravenous  Once 11/23/16 1553 11/23/16 1705   11/23/16 1530  cefTRIAXone (ROCEPHIN) 1 g in dextrose 5 % 50 mL IVPB  Status:  Discontinued     1 g 100 mL/hr over 30 Minutes Intravenous  Once 11/23/16 1528 11/23/16 1551      Assessment/Plan: s/p Procedure(s): APPENDECTOMY LAPAROSCOPIC   Condition of appendix noted and discussed with Dr. Rosana Hoes.  Recommend continuing IV antibiotic for today and possibly discharge later on this afternoon or tomorrow.  Florene Glen, MD, FACS  11/24/2016

## 2016-11-24 NOTE — Discharge Instructions (Addendum)
In addition to included general post-operative instructions for Laparoscopic Appendectomy,  Diet: Resume home heart healthy diet.   Activity: No heavy lifting >20 pounds (children, pets, laundry, garbage) or strenuous activity until follow-up, but light activity and walking are encouraged. Do not drive or drink alcohol if taking narcotic pain medications.  Wound care: 2 days after surgery (Thursday, 11/8), may shower/get incision wet with soapy water and pat dry (do not rub incisions), but no baths or submerging incision underwater until follow-up.   Medications: Resume all home medications AND complete prescribed antibiotics even if feeling better/well. For mild to moderate pain: acetaminophen (Tylenol) or ibuprofen (if no kidney disease). Combining Tylenol with alcohol can substantially increase your risk of causing liver disease. Narcotic pain medications, if prescribed, can be used for severe pain, though may cause nausea, constipation, and drowsiness. Do not combine Tylenol and Percocet (or similar) within a 6 hour period as Percocet contains Tylenol. If you do not need the narcotic pain medication, you do not need to fill the prescription.  Call office [phone]) at any time if any questions, worsening pain, fevers/chills, bleeding, drainage from incision site, or other concerns.

## 2016-11-25 MED ORDER — CIPROFLOXACIN HCL 500 MG PO TABS: 500.0000 mg | ORAL_TABLET | Freq: Two times a day (BID) | ORAL | 1 refills | Status: DC

## 2016-11-25 MED ORDER — OXYCODONE-ACETAMINOPHEN 5-325 MG PO TABS: 1.0000 | ORAL_TABLET | ORAL | 0 refills | Status: DC | PRN

## 2016-11-25 MED ORDER — METRONIDAZOLE 500 MG PO TABS: 500.0000 mg | ORAL_TABLET | Freq: Three times a day (TID) | ORAL | 1 refills | Status: DC

## 2016-11-25 NOTE — Anesthesia Postprocedure Evaluation (Signed)
Anesthesia Post Note  Patient: Sherri Forbes  Procedure(s) Performed: APPENDECTOMY LAPAROSCOPIC (N/A )  Patient location during evaluation: PACU Anesthesia Type: General Level of consciousness: awake and alert Pain management: pain level controlled Vital Signs Assessment: post-procedure vital signs reviewed and stable Respiratory status: spontaneous breathing, nonlabored ventilation, respiratory function stable and patient connected to nasal cannula oxygen Cardiovascular status: blood pressure returned to baseline and stable Postop Assessment: no apparent nausea or vomiting Anesthetic complications: no     Last Vitals:  Vitals:   11/25/16 0458 11/25/16 0801  BP: 121/69 138/69  Pulse: 86 77  Resp: 19 18  Temp: 36.5 C   SpO2: 98% 98%    Last Pain:  Vitals:   11/25/16 0857  TempSrc:   PainSc: 4                  Martha Clan

## 2016-11-25 NOTE — Progress Notes (Deleted)
  November 25, 2016  Patient: Sherri Forbes  Date of Birth: [birth year]  Date of Visit: 11/23/2016    To Whom It May Concern:  Sheletha Bow was seen and treated in our emergency department or urgent care center on 11/23/2016. SHRON OZER may return to work on 11/26/2016 with restriction of no lifting.   Sincerely,

## 2016-11-25 NOTE — Progress Notes (Signed)
Pt discharged per MD order. Prescription and work note given to pt. Discharge instructions reviewed with pt. IV removed. All questions answered to pt satisfaction. Pt taken to car via wheelchair by volunteer.

## 2016-11-25 NOTE — Progress Notes (Signed)
2 Days Post-Op  Subjective: Status post laparoscopic appendectomy with microperforation.  She is doing well and feeling well tolerating a regular diet is ready to go home.  Objective: Vital signs in last 24 hours: Temp:  [97.6 F (36.4 C)-98.2 F (36.8 C)] 97.7 F (36.5 C) (11/08 0458) Pulse Rate:  [77-86] 77 (11/08 0801) Resp:  [18-19] 18 (11/08 0801) BP: (118-138)/(62-77) 138/69 (11/08 0801) SpO2:  [96 %-98 %] 98 % (11/08 0801) Last BM Date: 11/22/16  Intake/Output from previous day: 11/07 0701 - 11/08 0700 In: 4003 [P.O.:1320; I.V.:2533; IV Piggyback:150] Out: 400 [Urine:400] Intake/Output this shift: No intake/output data recorded.  Physical exam:  Soft nontender abdomen wounds are clean with Dermabond no erythema  Lab Results: CBC  Recent Labs    11/23/16 1307 11/24/16 0422  WBC 12.3* 13.1*  HGB 13.9 12.8  HCT 41.3 38.1  PLT 240 195   BMET Recent Labs    11/23/16 1307  NA 137  K 3.7  CL 101  CO2 24  GLUCOSE 117*  BUN 19  CREATININE 0.71  CALCIUM 9.3   PT/INR No results for input(s): LABPROT, INR in the last 72 hours. ABG No results for input(s): PHART, HCO3 in the last 72 hours.  Invalid input(s): PCO2, PO2  Studies/Results: Ct Abdomen Pelvis W Contrast  Addendum Date: 11/23/2016   ADDENDUM REPORT: 11/23/2016 17:50 ADDENDUM: These results were called by telephone at the time of interpretation on 11/23/2016 at 5:47 pm to Dr. Merlyn Lot , who verbally acknowledged these results. Electronically Signed   By: Fidela Salisbury M.D.   On: 11/23/2016 17:50   Result Date: 11/23/2016 CLINICAL DATA:  Right-sided abdominal pain, history of kidney stones. EXAM: CT ABDOMEN AND PELVIS WITH CONTRAST TECHNIQUE: Multidetector CT imaging of the abdomen and pelvis was performed using the standard protocol following bolus administration of intravenous contrast. CONTRAST:  173mL ISOVUE-300 IOPAMIDOL (ISOVUE-300) INJECTION 61% COMPARISON:  None. FINDINGS: Lower  chest: Small pericardial effusion with maximum transverse diameter of 11 mm. Hepatobiliary: Mild hepatic steatosis. Normal appearance of the gallbladder. Pancreas: Unremarkable. No pancreatic ductal dilatation or surrounding inflammatory changes. Spleen: Normal in size without focal abnormality. Adrenals/Urinary Tract: No adrenal masses. Normal appearance of the right kidney. No evidence of hydronephrosis. Partially calcified left upper pole renal cortical scarring. Partially exophytic left lower pole benign-appearing cyst measuring 2.6 cm. Stomach/Bowel: Stomach is within normal limits. The appendix is thickened to 11 mm with diffuse mucosal edema and carie appendiceal fat stranding. Small amount of fluid is also present in the right pericolic gutter/right pelvis. Findings are consistent with acute appendicitis. Appendicolith is seen in the proximal appendix, image 44/111, sequence 4, coronal images. Micro rupture cannot be excluded. Organized abscess is not seen. No evidence of small-bowel obstruction. Colon has normal appearance. Vascular/Lymphatic: Aortic atherosclerosis. No enlarged abdominal or pelvic lymph nodes. Reproductive: Uterus and bilateral adnexa are unremarkable. Other: No abdominal wall hernia or abnormality. Musculoskeletal: No acute or significant osseous findings. IMPRESSION: Thickened appendix with marked periappendiceal inflammatory changes and small amount of free fluid. Findings consistent with acute appendicitis. Micro rupture cannot be excluded. No evidence of abscess formation. Likely reactive mildly enlarged right lower quadrant mesenteric lymph nodes. Advanced for age atherosclerotic disease of the aorta. Calcified cortical scarring in the left kidney. Benign-appearing left renal cyst. Electronically Signed: By: Fidela Salisbury M.D. On: 11/23/2016 17:45    Anti-infectives: Anti-infectives (From admission, onward)   Start     Dose/Rate Route Frequency Ordered Stop   11/25/16  0000  ciprofloxacin (CIPRO) 500 MG tablet     500 mg Oral 2 times daily 11/25/16 0801     11/25/16 0000  metroNIDAZOLE (FLAGYL) 500 MG tablet     500 mg Oral 3 times daily 11/25/16 0801     11/24/16 1600  cefTRIAXone (ROCEPHIN) 2 g in dextrose 5 % 50 mL IVPB     2 g 100 mL/hr over 30 Minutes Intravenous Every 24 hours 11/23/16 2307     11/24/16 0300  metroNIDAZOLE (FLAGYL) IVPB 500 mg     500 mg 100 mL/hr over 60 Minutes Intravenous Every 8 hours 11/23/16 2307     11/23/16 1950  cefTRIAXone (ROCEPHIN) 20 MG/ML IVPB 50 mL    Comments:  Galen Daft   : cabinet override      11/23/16 1950 11/24/16 0759   11/23/16 1800  metroNIDAZOLE (FLAGYL) IVPB 500 mg     500 mg 100 mL/hr over 60 Minutes Intravenous  Once 11/23/16 1749 11/23/16 1947   11/23/16 1600  cefTRIAXone (ROCEPHIN) 1 g in dextrose 5 % 50 mL IVPB - Premix     1 g 100 mL/hr over 30 Minutes Intravenous  Once 11/23/16 1553 11/23/16 1705   11/23/16 1530  cefTRIAXone (ROCEPHIN) 1 g in dextrose 5 % 50 mL IVPB  Status:  Discontinued     1 g 100 mL/hr over 30 Minutes Intravenous  Once 11/23/16 1528 11/23/16 1551      Assessment/Plan: s/p Procedure(s): APPENDECTOMY LAPAROSCOPIC   Status post laparoscopic appendectomy.  Patient will be sent home on oral antibiotics to the risk of translocation for microperforation.  The rationale for this was discussed with the patient and she understood and agreed with this plan she is instructed to return to the emergency room or call our office should she have fevers or worsening pain.  Florene Glen, MD, FACS  11/25/2016

## 2016-11-25 NOTE — Discharge Summary (Signed)
Physician Discharge Summary  Patient ID: Sherri Forbes MRN: [id number] DOB/AGE: 1970/07/27 46 y.o.  Admit date: 11/23/2016 Discharge date: 11/25/2016   Discharge Diagnoses:  Active Problems:   Acute appendicitis with rupture   Procedures: Upper scopic appendectomy  Hospital Course: This a patient with a microperforation of the acute appendicitis.  She was taken the operating room by Dr. Rosana Hoes where laparoscopic appendectomy was performed.  She was treated with IV antibiotics and transition to oral antibiotics and discharged on Cipro Flagyl and oral analgesics to follow-up in our office in 10 days.  She is instructed to shower and will follow-up sooner if she experiences any worsening of her pain or fevers.  Consults: None  Disposition: Final discharge disposition not confirmed   Allergies as of 11/25/2016      Reactions   Penicillins Other (See Comments)   Sulfa Antibiotics Hives      Medication List    TAKE these medications   benazepril 20 MG tablet Commonly known as:  LOTENSIN Take 20 mg daily by mouth.   ciprofloxacin 500 MG tablet Commonly known as:  CIPRO Take 1 tablet (500 mg total) 2 (two) times daily by mouth.   metroNIDAZOLE 500 MG tablet Commonly known as:  FLAGYL Take 1 tablet (500 mg total) 3 (three) times daily by mouth.   oxyCODONE-acetaminophen 5-325 MG tablet Commonly known as:  PERCOCET/ROXICET Take 1-2 tablets every 4 (four) hours as needed by mouth for moderate pain.   PARoxetine 10 MG tablet Commonly known as:  PAXIL Take 10 mg daily by mouth.      Follow-up [facility]. Schedule an appointment as soon as possible for a visit in 2 week(s).   Specialty:  General Surgery Contact information: [address] [place]       Florene Glen, MD Follow up in 2 week(s).   Specialty:  Surgery Contact information: [place] [address] [phone]           Florene Glen, MD, FACS

## 2016-11-26 DIAGNOSIS — Z87891 Personal history of nicotine dependence: Secondary | ICD-10-CM | POA: Insufficient documentation

## 2016-11-26 DIAGNOSIS — N200 Calculus of kidney: Secondary | ICD-10-CM | POA: Insufficient documentation

## 2016-11-26 LAB — SURGICAL PATHOLOGY

## 2016-11-29 LAB — AEROBIC/ANAEROBIC CULTURE (SURGICAL/DEEP WOUND)

## 2016-11-29 LAB — AEROBIC/ANAEROBIC CULTURE W GRAM STAIN (SURGICAL/DEEP WOUND)

## 2016-12-02 ENCOUNTER — Ambulatory Visit (INDEPENDENT_AMBULATORY_CARE_PROVIDER_SITE_OTHER): Payer: 59 | Admitting: Surgery

## 2016-12-02 ENCOUNTER — Encounter: Payer: Self-pay | Admitting: Surgery

## 2016-12-02 VITALS — BP 108/75 | HR 81 | Temp 97.9°F | Ht 64.0 in | Wt 228.0 lb

## 2016-12-02 DIAGNOSIS — K3532 Acute appendicitis with perforation and localized peritonitis, without abscess: Secondary | ICD-10-CM

## 2016-12-02 NOTE — Progress Notes (Signed)
Surgical Clinic Progress/Follow-up Note   HPI:  46 y.o. Female presents to clinic for post-op follow-up evaluation 1 week s/p laparoscopic appendectomy for perforated appendicitis. Patient reports she has been eating a normal diet and feels much better with complete resolution of her RLQ abdominal pain and only minimal residual LLQ peri-incisional pain, denies N/V, diarrhea or constipation, fever/chills, CP, or SOB. 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  Review of Systems:  Constitutional: denies any other weight loss, fever, chills, or sweats  Eyes: denies any other vision changes, history of eye injury  ENT: denies sore throat, hearing problems  Respiratory: denies shortness of breath, wheezing  Cardiovascular: denies chest pain, palpitations  Gastrointestinal: abdominal pain, N/V, and bowel function as per HPI Musculoskeletal: denies any other joint pains or cramps  Skin: Denies any other rashes or skin discolorations  Neurological: denies any other headache, dizziness, weakness  Psychiatric: denies any other depression, anxiety  All other review of systems: otherwise negative   Vital Signs:  BP 108/75   Pulse 81   Temp 97.9 F (36.6 C) (Oral)   Ht 5\' 4"  (1.626 m)   Wt 228 lb (103.4 kg)   BMI 39.14 kg/m    Physical Exam:  Constitutional:  -- Normal body habitus  -- Awake, alert, and oriented x3  Eyes:  -- Pupils equally round and reactive to light  -- No scleral icterus  Ear, nose, throat:  -- No jugular venous distension  -- No nasal drainage, bleeding Pulmonary:  -- No crackles -- Equal breath sounds bilaterally -- Breathing non-labored at rest Cardiovascular:  -- S1, S2 present  -- No pericardial rubs  Gastrointestinal:  -- Soft, nontender, non-distended, no guarding/rebound -- Incisions well-approximated without  surrounding erythema or drainage -- No abdominal masses appreciated, pulsatile or otherwise  Musculoskeletal / Integumentary:  -- Wounds or skin discoloration: None appreciated except post-surgical abdominal wounds  -- Extremities: B/L UE and LE FROM, hands and feet warm, no edema  Neurologic:  -- Motor function: intact and symmetric  -- Sensation: intact and symmetric   Assessment:  46 y.o. yo Female with a problem list including...  Patient Active Problem List   Diagnosis Date Noted  . Former smoker 11/26/2016  . Nephrolithiasis 11/26/2016  . Acute appendicitis with rupture 11/23/2016  . Morbid obesity with BMI of 40.0-44.9, adult ([place]) 12/30/2015  . Left ureteral stone 12/28/2015  . Hypercholesterolemia 11/22/2014  . Vitamin B 12 deficiency 04/22/2014  . Carpal tunnel syndrome, bilateral 04/19/2014  . Dyshidrotic hand dermatitis 04/19/2014  . Paresthesia 04/19/2014  . Dysuria 05/23/2013  . Urinary symptom or sign 05/23/2013  . Excess ear wax 11/15/2012  . Hypertension 11/15/2012  . Prediabetes 10/30/2012    presents to clinic for post-op follow-up evaluation, doing well s/p laparoscopic appendectomy for acute perforated appendicitis.  Plan:   - pain control prn, minimize narcotics  - complete prescribed course of antibiotics even if feeling better/well  - no heavy lifting, strenuous activity or submerging incisions under water x 1 more week  - after 1 more week, gradually resume activities without restrictions, return to clinic as needed  - instructed patient to call office if any questions or concerns  All of the above recommendations were discussed with the patient, and all of patient's questions were answered to her expressed satisfaction.  -- Marilynne Drivers Rosana Hoes, MD, [place]: [facility] - Partnering for exceptional care. Office: [phone]

## 2016-12-02 NOTE — Patient Instructions (Signed)

## 2017-02-21 ENCOUNTER — Other Ambulatory Visit: Payer: Self-pay | Admitting: Family Medicine

## 2017-02-21 DIAGNOSIS — Z1231 Encounter for screening mammogram for malignant neoplasm of breast: Secondary | ICD-10-CM

## 2017-03-30 ENCOUNTER — Ambulatory Visit
Admission: RE | Admit: 2017-03-30 | Discharge: 2017-03-30 | Disposition: A | Discharge status: Home / Self Care | Payer: 59 | Source: Ambulatory Visit | Attending: Family Medicine | Admitting: Family Medicine

## 2017-03-30 DIAGNOSIS — Z1231 Encounter for screening mammogram for malignant neoplasm of breast: Secondary | ICD-10-CM | POA: Insufficient documentation

## 2018-02-16 ENCOUNTER — Encounter: Payer: Self-pay | Admitting: Obstetrics and Gynecology

## 2018-02-16 ENCOUNTER — Other Ambulatory Visit (HOSPITAL_COMMUNITY)
Admission: RE | Admit: 2018-02-16 | Discharge: 2018-02-16 | Disposition: A | Discharge status: Home / Self Care | Payer: 59 | Source: Ambulatory Visit | Attending: Obstetrics and Gynecology | Admitting: Obstetrics and Gynecology

## 2018-02-16 ENCOUNTER — Ambulatory Visit (INDEPENDENT_AMBULATORY_CARE_PROVIDER_SITE_OTHER): Payer: 59 | Admitting: Obstetrics and Gynecology

## 2018-02-16 VITALS — BP 140/82 | HR 91 | Ht 64.0 in | Wt 246.0 lb

## 2018-02-16 DIAGNOSIS — Z124 Encounter for screening for malignant neoplasm of cervix: Secondary | ICD-10-CM | POA: Insufficient documentation

## 2018-02-16 DIAGNOSIS — Z01419 Encounter for gynecological examination (general) (routine) without abnormal findings: Secondary | ICD-10-CM

## 2018-02-16 DIAGNOSIS — Z1331 Encounter for screening for depression: Secondary | ICD-10-CM

## 2018-02-16 DIAGNOSIS — Z13220 Encounter for screening for lipoid disorders: Secondary | ICD-10-CM

## 2018-02-16 DIAGNOSIS — Z0000 Encounter for general adult medical examination without abnormal findings: Secondary | ICD-10-CM

## 2018-02-16 DIAGNOSIS — Z1239 Encounter for other screening for malignant neoplasm of breast: Secondary | ICD-10-CM

## 2018-02-16 DIAGNOSIS — F320 Major depressive disorder, single episode, mild: Secondary | ICD-10-CM

## 2018-02-16 DIAGNOSIS — Z131 Encounter for screening for diabetes mellitus: Secondary | ICD-10-CM

## 2018-02-16 NOTE — Progress Notes (Signed)
Gynecology Annual Exam   PCP: System, Pcp Not In  Chief Complaint:  Chief Complaint  Patient presents with  . Gynecologic Exam    Leaking bladder, pain with intercourse     History of Present Illness: Patient is a 48 y.o. G1P1001 presents for annual exam. The patient has no complaints today.   LMP: No LMP recorded. Patient is perimenopausal. She reports she has not had a period since June 2019.   The patient is sexually active. She currently uses none for contraception. She admits to dyspareunia.  The patient does perform self breast exams.  There is notable family history of breast or ovarian cancer in her family.  The patient wears seatbelts: yes.   The patient has regular exercise: no.    The patient reports current symptoms of depression.    Aracelys is tearful today in her office visit.  She reports that she scheduled the visit because her mother recently passed and she does not want to die.  She says that her mother recently passed away from lung cancer which was diagnosed in April 2019.  She was the youngest child.  She reports that she had 4 sisters.  She reports that her and her mom were very close.  She says that she called her mom multiple times today just to hear her breathe.  Her passing has been very distressful for her.  She is also going through symptoms of menopause such as hot flashes sleep disturbances and irritability.  This is putting a strain on her personal relationships.  She references her relationship with her a 21-year-old daughter.  Her daughter is soon leaving for college and she feels like she is building a wall between them personally.  She is very proud of her daughter and her daughter's accomplishments.  She reports that she has a close relationship with her husband and that he is a good listener.   She is concerned about her health.  We reviewed her cardiovascular risk factors with the ASCVD app, which were distressing to the patient.  She was taking a  statin in the past but stopped that medication because of the side effects.  She reports that her blood pressure is elevated at today's visit but at other times it is normal.  She reports that she is keeping a log and sharing this with her primary care physician.   Review of Systems: Review of Systems  Constitutional: Negative for chills, fever, malaise/fatigue and weight loss.  HENT: Negative for congestion, hearing loss and sinus pain.   Eyes: Negative for blurred vision and double vision.  Respiratory: Negative for cough, sputum production, shortness of breath and wheezing.   Cardiovascular: Negative for chest pain, palpitations, orthopnea and leg swelling.  Gastrointestinal: Negative for abdominal pain, constipation, diarrhea, nausea and vomiting.  Genitourinary: Negative for dysuria, flank pain, frequency, hematuria and urgency.  Musculoskeletal: Negative for back pain, falls and joint pain.  Skin: Negative for itching and rash.  Neurological: Negative for dizziness and headaches.  Psychiatric/Behavioral: Positive for depression. Negative for substance abuse and suicidal ideas. The patient is nervous/anxious.     Past Medical History:  Past Medical History:  Diagnosis Date  . Carpal tunnel syndrome, bilateral 04/19/2014  . Dyshidrotic hand dermatitis 04/19/2014  . Excess ear wax 11/15/2012  . Hypertension   . Kidney stone   . Paresthesia 04/19/2014  . Prediabetes 10/30/2012  . Vitamin B 12 deficiency 04/22/2014    Past Surgical History:  Past Surgical History:  Procedure Laterality Date  . APPENDECTOMY  11/23/2016   Dr. Rosana Hoes  . KIDNEY STONE SURGERY    . LAPAROSCOPIC APPENDECTOMY N/A 11/23/2016   Procedure: APPENDECTOMY LAPAROSCOPIC;  Surgeon: Vickie Epley, MD;  Location: [facility] ORS;  Service: General;  Laterality: N/A;    Gynecologic History:  No LMP recorded. Patient is perimenopausal. Contraception: none Last Pap: Results were: unknown   Obstetric History:  G1P1001  Family History:  Family History  Problem Relation Age of Onset  . Breast cancer Maternal Grandmother 60  . Emphysema Maternal Grandmother   . Melanoma Maternal Grandmother   . Lung cancer Mother   . COPD Mother   . Colon cancer Father     Social History:  Social History   Socioeconomic History  . Marital status: Married    Spouse name: Not on file  . Number of children: Not on file  . Years of education: Not on file  . Highest education level: Not on file  Occupational History  . Not on file  Social Needs  . Financial resource strain: Not on file  . Food insecurity:    Worry: Not on file    Inability: Not on file  . Transportation needs:    Medical: Not on file    Non-medical: Not on file  Tobacco Use  . Smoking status: Never Smoker  . Smokeless tobacco: Never Used  Substance and Sexual Activity  . Alcohol use: Yes    Frequency: Never    Comment: Social  . Drug use: No  . Sexual activity: Yes    Birth control/protection: None, Post-menopausal  Lifestyle  . Physical activity:    Days per week: Not on file    Minutes per session: Not on file  . Stress: Not on file  Relationships  . Social connections:    Talks on phone: Not on file    Gets together: Not on file    Attends religious service: Not on file    Active member of club or organization: Not on file    Attends meetings of clubs or organizations: Not on file    Relationship status: Not on file  . Intimate partner violence:    Fear of current or ex partner: Not on file    Emotionally abused: Not on file    Physically abused: Not on file    Forced sexual activity: Not on file  Other Topics Concern  . Not on file  Social History Narrative  . Not on file    Allergies:  Allergies  Allergen Reactions  . Penicillins Other (See Comments)  . Sulfa Antibiotics Hives    Medications: Prior to Admission medications   Medication Sig Start Date End Date Taking? Authorizing Provider  benazepril  (LOTENSIN) 20 MG tablet Take 20 mg daily by mouth.   Yes [provider]  clindamycin (CLEOCIN) 300 MG capsule  02/15/18  Yes [provider]  PARoxetine (PAXIL) 10 MG tablet Take 10 mg daily by mouth.   Yes [provider]    Physical Exam Vitals: Blood pressure 140/82, pulse 91, height 5\' 4"  (1.626 m), weight 246 lb (111.6 kg).  General: NAD HEENT: normocephalic, anicteric Thyroid: no enlargement, no palpable nodules Pulmonary: No increased work of breathing, CTAB Cardiovascular: RRR, distal pulses 2+ Breast: Breast symmetrical, no tenderness, no palpable nodules or masses, no skin or nipple retraction present, no nipple discharge.  No axillary or supraclavicular lymphadenopathy. Abdomen: NABS, soft, non-tender, non-distended.  Umbilicus without lesions.  No hepatomegaly,  splenomegaly or masses palpable. No evidence of hernia  Genitourinary:  External: Normal external female genitalia.  Normal urethral meatus, normal Bartholin's and Skene's glands.    Vagina: Normal vaginal mucosa, no evidence of prolapse.    Cervix: Grossly normal in appearance, no bleeding  Uterus: Non-enlarged, mobile, normal contour.  No CMT  Adnexa: ovaries non-enlarged, no adnexal masses  Rectal: deferred  Lymphatic: no evidence of inguinal lymphadenopathy Extremities: no edema, erythema, or tenderness Neurologic: Grossly intact Psychiatric: mood appropriate, affect full  Female chaperone present for pelvic and breast  portions of the physical exam    Assessment: 48 y.o. G1P1001 routine annual exam  Plan: Problem List Items Addressed This Visit    None    Visit Diagnoses    Cervical cancer screening    -  Primary   Relevant Orders   Cytology - PAP   Healthcare maintenance       Screening cholesterol level       Screening for diabetes mellitus       Screening for depression       Current mild episode of major depressive disorder without prior episode ([place])       Relevant  Orders   Ambulatory referral to Psychiatry   Screening breast examination       Relevant Orders   MM DIGITAL SCREENING BILATERAL      2) STI screening  was notoffered and therefore not obtained  2)  ASCCP guidelines and rational discussed.  Patient opts for every 3 years screening interval. Pap collected and sent today.   3) Contraception - the patient is currently using  none.  She is happy with her current form of contraception and plans to continue  4) Routine healthcare maintenance including cholesterol, diabetes screening discussed managed by PCP. Patient not taking her Statin. Discussed the importance of continuing this medication.   5) Discussed referral to psychiatry for management of her depression and anxiety. Referral placed. Patient provided with a list of therapist. Encouraged her to seek counseling for listed family stressors and significant life events such as the passing of her mother.   6)  Return in about 2 weeks (around 03/02/2018) for GYN visit.  Adrian Prows MD [facility], [facility] 02/16/2018 9:47 AM

## 2018-02-16 NOTE — Patient Instructions (Signed)
Therapists/Counselors/Psychologists   Ndia Canada, LPC  & Michelle Van Horton    [phone]        [address]       [place], [place] [address]        Gary Bailey, CSW [phone] [address] [place], [place] [address]  Julia Tabor, LPC        [phone]        [address]      [place], [place] [address]        [place], [place] [phone] [address] [place], [place] [address]   Joanna Warren, LMFT       [phone]        [address]       [place], [place] [address]        Tina Thompson [phone] [address] [place], [place] [address]  Amanda Miller        [phone]        [address]       [place], [place] [address]        Bart McCormick [phone] [address] [place], [place] [address]  Cristin Saffo, PsyD       [phone]        [address]       [place], [place] [address]        Cheryl Lawson, LPC [phone] [address]  [place], [place] [address]   Courtney Jones        [phone]        [address]      [place], [place] [address]         Laura Ellington [facility] [phone] [email or web address]   Sation Konchella       [phone]        [address]       [place], [place] [address]        Carmen Bork [facility] [phone] [email or web address]    

## 2018-02-20 LAB — CYTOLOGY - PAP
Diagnosis: NEGATIVE
HPV: NOT DETECTED

## 2018-02-24 NOTE — Progress Notes (Signed)
Please call patient with normal result. Thank you,  Dr. Gilman Schmidt

## 2018-02-28 NOTE — Progress Notes (Signed)
Pt aware and relieved. °

## 2018-07-18 ENCOUNTER — Ambulatory Visit
Admission: RE | Admit: 2018-07-18 | Discharge: 2018-07-18 | Disposition: A | Discharge status: Home / Self Care | Payer: 59 | Source: Ambulatory Visit | Attending: Obstetrics and Gynecology | Admitting: Obstetrics and Gynecology

## 2018-07-18 ENCOUNTER — Other Ambulatory Visit: Payer: Self-pay

## 2018-07-18 DIAGNOSIS — Z1231 Encounter for screening mammogram for malignant neoplasm of breast: Secondary | ICD-10-CM | POA: Diagnosis present

## 2018-07-18 DIAGNOSIS — Z1239 Encounter for other screening for malignant neoplasm of breast: Secondary | ICD-10-CM

## 2019-07-16 ENCOUNTER — Other Ambulatory Visit: Payer: Self-pay | Admitting: Family Medicine

## 2019-07-16 DIAGNOSIS — Z1231 Encounter for screening mammogram for malignant neoplasm of breast: Secondary | ICD-10-CM

## 2019-07-25 ENCOUNTER — Ambulatory Visit
Admission: RE | Admit: 2019-07-25 | Discharge: 2019-07-25 | Disposition: A | Discharge status: Home / Self Care | Payer: 59 | Source: Ambulatory Visit | Attending: Family Medicine | Admitting: Family Medicine

## 2019-07-25 DIAGNOSIS — Z1231 Encounter for screening mammogram for malignant neoplasm of breast: Secondary | ICD-10-CM

## 2020-06-18 ENCOUNTER — Other Ambulatory Visit: Payer: Self-pay | Admitting: Family Medicine

## 2020-06-18 DIAGNOSIS — Z1231 Encounter for screening mammogram for malignant neoplasm of breast: Secondary | ICD-10-CM

## 2020-07-25 ENCOUNTER — Ambulatory Visit
Admission: RE | Admit: 2020-07-25 | Discharge: 2020-07-25 | Disposition: A | Discharge status: Home / Self Care | Payer: BC Managed Care – PPO | Source: Ambulatory Visit | Attending: Family Medicine | Admitting: Family Medicine

## 2020-07-25 ENCOUNTER — Other Ambulatory Visit: Payer: Self-pay

## 2020-07-25 DIAGNOSIS — Z1231 Encounter for screening mammogram for malignant neoplasm of breast: Secondary | ICD-10-CM | POA: Diagnosis present

## 2020-08-15 ENCOUNTER — Ambulatory Visit
Admission: EM | Admit: 2020-08-15 | Discharge: 2020-08-15 | Disposition: A | Discharge status: Home / Self Care | Payer: BC Managed Care – PPO | Attending: Emergency Medicine | Admitting: Emergency Medicine

## 2020-08-15 ENCOUNTER — Other Ambulatory Visit: Payer: Self-pay

## 2020-08-15 ENCOUNTER — Ambulatory Visit (INDEPENDENT_AMBULATORY_CARE_PROVIDER_SITE_OTHER): Payer: BC Managed Care – PPO

## 2020-08-15 DIAGNOSIS — R109 Unspecified abdominal pain: Secondary | ICD-10-CM | POA: Diagnosis not present

## 2020-08-15 DIAGNOSIS — R509 Fever, unspecified: Secondary | ICD-10-CM | POA: Insufficient documentation

## 2020-08-15 LAB — POCT URINALYSIS DIP (DEVICE)
Bilirubin Urine: NEGATIVE
Glucose, UA: NEGATIVE mg/dL
Ketones, ur: NEGATIVE mg/dL
Leukocytes,Ua: NEGATIVE
Nitrite: NEGATIVE
Protein, ur: NEGATIVE mg/dL
Specific Gravity, Urine: 1.025 (ref 1.005–1.030)
Urobilinogen, UA: 0.2 mg/dL (ref 0.0–1.0)
pH: 6 (ref 5.0–8.0)

## 2020-08-15 MED ORDER — KETOROLAC TROMETHAMINE 30 MG/ML IJ SOLN
30.0000 mg | Freq: Once | INTRAMUSCULAR | Status: AC
  Administered 2020-08-15: 30 mg via INTRAMUSCULAR

## 2020-08-15 MED ORDER — ACETAMINOPHEN 500 MG PO TABS
1000.0000 mg | ORAL_TABLET | Freq: Once | ORAL | Status: AC
  Administered 2020-08-15: 1000 mg via ORAL

## 2020-08-15 MED ORDER — KETOROLAC TROMETHAMINE 60 MG/2ML IM SOLN: 60.0000 mg | Freq: Once | INTRAMUSCULAR | Status: DC

## 2020-08-15 MED ORDER — PROMETHAZINE HCL 25 MG/ML IJ SOLN
25.0000 mg | Freq: Once | INTRAMUSCULAR | Status: AC
  Administered 2020-08-15: 25 mg via INTRAMUSCULAR

## 2020-08-15 NOTE — ED Triage Notes (Signed)
Patient states that she has been having a fever, back pain and body aches, chills and nausea x yesterday.

## 2020-08-15 NOTE — ED Provider Notes (Signed)
[facility]    CSN: [id number] Arrival date & time: 08/15/20  1924      History   Chief Complaint Chief Complaint  Patient presents with   Fever    HPI [name] is a 50 y.o. female.   HPI  50 year old female here for evaluation of fever and chills.  Patient reports that she is had fever, body aches, right-sided low back pain, chills, and nausea since yesterday.  She developed vomiting here in clinic.  She reports a fever but states that her temperature was 97.2 at home.  She does endorse urinary urgency and frequency as well as cloudy urine and an pneumonialike odor to her urine.  She has had abdominal bloating.  She denies pain with urination, blood in her urine, or or abdominal pain.  Patient states she has a history of kidney stones but this does not feel the same as a kidney stone.  Past Medical History:  Diagnosis Date   Carpal tunnel syndrome, bilateral 04/19/2014   Dyshidrotic hand dermatitis 04/19/2014   Excess ear wax 11/15/2012   Hypertension    Kidney stone    Paresthesia 04/19/2014   Prediabetes 10/30/2012   Vitamin B 12 deficiency 04/22/2014    Patient Active Problem List   Diagnosis Date Noted   Former smoker 11/26/2016   Nephrolithiasis 11/26/2016   Acute appendicitis with rupture 11/23/2016   Morbid obesity with BMI of 40.0-44.9, adult ([place]) 12/30/2015   Left ureteral stone 12/28/2015   Hypercholesterolemia 11/22/2014   Vitamin B 12 deficiency 04/22/2014   Carpal tunnel syndrome, bilateral 04/19/2014   Dyshidrotic hand dermatitis 04/19/2014   Paresthesia 04/19/2014   Dysuria 05/23/2013   Urinary symptom or sign 05/23/2013   Excess ear wax 11/15/2012   Hypertension 11/15/2012   Prediabetes 10/30/2012    Past Surgical History:  Procedure Laterality Date   APPENDECTOMY  11/23/2016   Dr. Rosana Hoes   KIDNEY STONE SURGERY     LAPAROSCOPIC APPENDECTOMY N/A 11/23/2016   Procedure: APPENDECTOMY LAPAROSCOPIC;  Surgeon: Vickie Epley, MD;   Location: [facility] ORS;  Service: General;  Laterality: N/A;    OB History     Gravida  1   Para  1   Term  1   Preterm      AB      Living  1      SAB      IAB      Ectopic      Multiple      Live Births  1            Home Medications    Prior to Admission medications   Medication Sig Start Date End Date Taking? Authorizing Provider  benazepril (LOTENSIN) 20 MG tablet Take 20 mg daily by mouth.   Yes [provider]  PARoxetine (PAXIL) 10 MG tablet Take 10 mg daily by mouth.   Yes [provider]  simvastatin (ZOCOR) 20 MG tablet Take 20 mg by mouth at bedtime. 07/23/20  Yes [provider]  clindamycin (CLEOCIN) 300 MG capsule  02/15/18   [provider]    Family History Family History  Problem Relation Age of Onset   Breast cancer Maternal Grandmother 75   Emphysema Maternal Grandmother    Melanoma Maternal Grandmother    Lung cancer Mother    COPD Mother    Colon cancer Father     Social History Social History   Tobacco Use   Smoking status: Never  Smokeless tobacco: Never  Vaping Use   Vaping Use: Never used  Substance Use Topics   Alcohol use: Yes    Comment: Social   Drug use: No     Allergies   Penicillins and Sulfa antibiotics   Review of Systems Review of Systems  Constitutional:  Positive for chills and fever. Negative for activity change and appetite change.  Gastrointestinal:  Positive for nausea and vomiting. Negative for abdominal pain and diarrhea.  Genitourinary:  Positive for flank pain, frequency and urgency. Negative for dysuria and hematuria.  Musculoskeletal:  Positive for back pain.  Skin:  Negative for rash.  Hematological: Negative.   Psychiatric/Behavioral: Negative.      Physical Exam Triage Vital Signs ED Triage Vitals  Enc Vitals Group     BP --      Pulse --      Resp --      Temp --      Temp src --      SpO2 --      Weight 08/15/20 1941 245 lb (111.1 kg)      Height 08/15/20 1941 '5\' 4"'$  (1.626 m)     Head Circumference --      Peak Flow --      Pain Score 08/15/20 1940 8     Pain Loc --      Pain Edu? --      Excl. in [place]? --    No data found.  Updated Vital Signs BP (!) 152/106 (BP Location: Right Arm)   Pulse (!) 128   Temp (!) 102.7 F (39.3 C) (Oral)   Resp 18   Ht '5\' 4"'$  (1.626 m)   Wt 245 lb (111.1 kg)   SpO2 93%   BMI 42.05 kg/m   Visual Acuity Right Eye Distance:   Left Eye Distance:   Bilateral Distance:    Right Eye Near:   Left Eye Near:    Bilateral Near:     Physical Exam Vitals and nursing note reviewed.  Constitutional:      General: She is not in acute distress.    Appearance: Normal appearance. She is normal weight. She is not ill-appearing.  HENT:     Head: Normocephalic and atraumatic.  Cardiovascular:     Rate and Rhythm: Normal rate and regular rhythm.     Pulses: Normal pulses.     Heart sounds: Normal heart sounds. No murmur heard.   No gallop.  Pulmonary:     Effort: Pulmonary effort is normal.     Breath sounds: Normal breath sounds. No wheezing, rhonchi or rales.  Abdominal:     Palpations: Abdomen is soft.     Tenderness: There is no abdominal tenderness. There is no right CVA tenderness, left CVA tenderness, guarding or rebound.  Skin:    General: Skin is warm and dry.     Capillary Refill: Capillary refill takes less than 2 seconds.     Findings: No erythema or rash.  Neurological:     General: No focal deficit present.     Mental Status: She is alert and oriented to person, place, and time.  Psychiatric:        Mood and Affect: Mood normal.        Behavior: Behavior normal.        Thought Content: Thought content normal.        Judgment: Judgment normal.     UC Treatments / Results  Labs (all labs ordered are listed,  but only abnormal results are displayed) Labs Reviewed  POCT URINALYSIS DIP (DEVICE) - Abnormal; Notable for the following components:      Result Value   Hgb  urine dipstick MODERATE (*)    All other components within normal limits  POCT URINALYSIS DIPSTICK, ED / UC    EKG   Radiology DG Abdomen 1 View  Result Date: 08/15/2020 CLINICAL DATA:  Fever, back and right flank pain. EXAM: ABDOMEN - 1 VIEW COMPARISON:  CT abdomen and pelvis 11/23/2016. FINDINGS: The bowel gas pattern is normal. No radio-opaque calculi or other significant radiographic abnormality are seen. Calcification in the left kidney correlating with partially calcified scar as seen on prior CT noted. IMPRESSION: No acute abnormality or finding to explain the patient's symptoms. Negative for urinary tract stones. Partially calcified scar upper pole left kidney is unchanged. Electronically Signed   By: Inge Rise M.D.   On: 08/15/2020 20:25    Procedures Procedures (including critical care time)  Medications Ordered in UC Medications  promethazine (PHENERGAN) injection 25 mg (25 mg Intramuscular Given 08/15/20 1957)  acetaminophen (TYLENOL) tablet 1,000 mg (1,000 mg Oral Given 08/15/20 2004)  ketorolac (TORADOL) 30 MG/ML injection 30 mg (30 mg Intramuscular Given 08/15/20 2007)    Initial Impression / Assessment and Plan / UC Course  I have reviewed the triage vital signs and the nursing notes.  Pertinent labs & imaging results that were available during my care of the patient were reviewed by me and considered in my medical decision making (see chart for details).  Patient is a very pleasant though ill-appearing 50 year old female here for evaluation of multiple somatic complaints and urinary complaints that started yesterday.  Patient has been nauseous but she has not vomited until she came to clinic.  She had 1 episode of dry heaving and emesis.  She has not been febrile.  Patient's physical exam reveals a benign cardiopulmonary exam with clear lung sounds in all fields.  Heart sounds are S1-S2.  Patient has no CVA tenderness on exam.  Abdomen is soft, nontender,  nondistended.  Will check urinalysis as patient has had urinary urgency and frequency with cloudy urine.  Urinalysis shows moderate blood but is otherwise unremarkable.  Will obtain KUB to evaluate for stone.  Patient is febrile 102.7 and tachycardia 128 bpm.  Will give 1 g of Tylenol to help with fever and also 60 of Toradol IM for her 8 out of 10 right flank pain.  KUB independently reviewed and evaluated by me.  Patient called there is no evidence of a renal calculi seen.  Nonspecific air-fluid levels and stool colon.  Due to patient's elevated fever without explainable source I feel patient is a CT scan of her abdomen and therefore I am recommending she go to the emergency department for evaluation.  Patient has elected to go to [facility] via POV.  She rates her pain in her right flank as having decreased from an 8/10 to a 5-6/10.  She also reports that she is still nauseous despite the IM Phenergan injection.   Final Clinical Impressions(s) / UC Diagnoses   Final diagnoses:  Fever of unknown origin  Right flank pain     Discharge Instructions      As we discussed, your urinalysis does not show any signs of infection but there is a significant amount of blood in it and your x-ray did not show the presence of a readily identifiable stone.  Given the fact that you are still  nauseated, you are febrile at almost 103, you are continuing having pain on your right flank I feel that you need advanced imaging that we cannot provide here in the urgent care and therefore I suggest you go to the emergency department.  You have elected to go to [facility].     ED Prescriptions   None    PDMP not reviewed this encounter.   Margarette Canada, NP 08/15/20 2030

## 2020-08-15 NOTE — ED Notes (Signed)
Patient is being discharged from the Urgent Care and sent to the Emergency Department via POV . Per Charmayne Sheer, NP, patient is in need of higher level of care due to Fever of unknown origin, vomiting, and flank pain. Patient is aware and verbalizes understanding of plan of care.  Vitals:   08/15/20 1943  BP: (!) 152/106  Pulse: (!) 128  Resp: 18  Temp: (!) 102.7 F (39.3 C)  SpO2: 93%

## 2020-08-15 NOTE — Discharge Instructions (Addendum)
As we discussed, your urinalysis does not show any signs of infection but there is a significant amount of blood in it and your x-ray did not show the presence of a readily identifiable stone.  Given the fact that you are still nauseated, you are febrile at almost 103, you are continuing having pain on your right flank I feel that you need advanced imaging that we cannot provide here in the urgent care and therefore I suggest you go to the emergency department.  You have elected to go to [facility].

## 2021-03-06 ENCOUNTER — Encounter: Payer: Self-pay | Admitting: Gastroenterology

## 2021-03-08 ENCOUNTER — Encounter: Payer: Self-pay | Admitting: Gastroenterology

## 2021-03-08 NOTE — H&P (Signed)
Pre-Procedure H&P   Patient ID: Sherri Forbes is a 51 y.o. female.  Gastroenterology Provider: Annamaria Helling, DO  Referring Provider: Dr. Iona Beard PCP: Sharyne Peach, MD  Date: 03/09/2021  HPI Ms. Sherri Forbes is a 51 y.o. female who presents today for Colonoscopy for Initial colonoscopy-family history of colon cancer-father. Initial screening colonoscopy.  Family history of colon cancer in father unknown age.  Also with mother with colon polyps. Patient's current bowel movements are normal without blood or melena, diarrhea or constipation. 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 Rosana Hoes   KIDNEY STONE SURGERY     LAPAROSCOPIC APPENDECTOMY N/A 11/23/2016   Procedure: APPENDECTOMY LAPAROSCOPIC;  Surgeon: Vickie Epley, MD;  Location: [facility] ORS;  Service: General;  Laterality: N/A;    Family History Father-colon cancer unknown age Mother colon polyps No other h/o GI disease or malignancy  Review of Systems  Constitutional:  Negative for activity change, appetite change, chills, diaphoresis, fatigue, fever and unexpected weight change.  HENT:  Negative for trouble swallowing and voice change.   Respiratory:  Negative for shortness of breath and wheezing.   Cardiovascular:  Positive for palpitations. Negative for chest pain and leg swelling.  Gastrointestinal:  Negative for abdominal distention, abdominal pain, anal bleeding, blood in stool, constipation, diarrhea, nausea, rectal pain and vomiting.  Musculoskeletal:  Negative for arthralgias  and myalgias.  Skin:  Negative for color change and pallor.  Neurological:  Negative for dizziness, syncope and weakness.  Psychiatric/Behavioral:  Negative for confusion.   All other systems reviewed and are negative.   Medications No current facility-administered medications on file prior to encounter.   Current Outpatient Medications on File Prior to Encounter  Medication Sig Dispense Refill   benazepril (LOTENSIN) 20 MG tablet Take 20 mg daily by mouth.     methenamine (HIPREX) 1 g tablet Take 1 g by mouth 2 (two) times daily with a meal.     PARoxetine (PAXIL) 10 MG tablet Take 10 mg daily by mouth.     simvastatin (ZOCOR) 20 MG tablet Take 20 mg by mouth at bedtime.     Cholecalciferol (VITAMIN D3) 10 MCG (400 UNIT) CAPS Take by mouth. (Patient not taking: Reported on 03/09/2021)     oxybutynin (DITROPAN-XL) 5 MG 24 hr tablet Take 5 mg by mouth at bedtime. (Patient not taking: Reported on 03/09/2021)     vitamin B-12 (CYANOCOBALAMIN) 1000 MCG tablet Take 1,000 mcg by mouth daily. (Patient not taking: Reported on 03/09/2021)      Pertinent medications related to GI and procedure were reviewed by me with the patient prior to the procedure   Current Facility-Administered Medications:    0.9 %  sodium chloride infusion, , Intravenous, Continuous, Annamaria Helling, DO, Last Rate: 20 mL/hr at 03/09/21 0717, New Bag at 03/09/21 0254      Allergies  Allergen Reactions   Penicillins Other (See Comments)   Sulfa Antibiotics Hives   Allergies were reviewed by me prior to the  procedure  Objective    Vitals:   03/09/21 0700  BP: (!) 155/79  Pulse: 88  Resp: 20  Temp: 97.9 F (36.6 C)  TempSrc: Temporal  SpO2: 97%  Weight: 111.1 kg  Height: 5\' 4"  (1.626 m)     Physical Exam Vitals and nursing note reviewed.  Constitutional:      General: She is not in acute distress.    Appearance: Normal appearance. She is obese. She is not ill-appearing, toxic-appearing or  diaphoretic.  HENT:     Head: Normocephalic and atraumatic.     Nose: Nose normal.     Mouth/Throat:     Mouth: Mucous membranes are moist.     Pharynx: Oropharynx is clear.  Eyes:     General: No scleral icterus.    Extraocular Movements: Extraocular movements intact.  Cardiovascular:     Rate and Rhythm: Normal rate and regular rhythm.     Heart sounds: Normal heart sounds. No murmur heard.   No friction rub. No gallop.  Pulmonary:     Effort: Pulmonary effort is normal. No respiratory distress.     Breath sounds: Normal breath sounds. No wheezing, rhonchi or rales.  Abdominal:     General: Bowel sounds are normal. There is no distension.     Palpations: Abdomen is soft.     Tenderness: There is no abdominal tenderness. There is no guarding or rebound.  Musculoskeletal:     Cervical back: Neck supple.     Right lower leg: No edema.     Left lower leg: No edema.  Skin:    General: Skin is warm and dry.     Coloration: Skin is not jaundiced or pale.  Neurological:     General: No focal deficit present.     Mental Status: She is alert and oriented to person, place, and time. Mental status is at baseline.  Psychiatric:        Mood and Affect: Mood normal.        Behavior: Behavior normal.        Thought Content: Thought content normal.        Judgment: Judgment normal.     Assessment:  Ms. Sherri Forbes is a 51 y.o. female  who presents today for Colonoscopy for Initial colonoscopy-family history of colon cancer-father.  Plan:  Colonoscopy with possible intervention today  Colonoscopy with possible biopsy, control of bleeding, polypectomy, and interventions as necessary has been discussed with the patient/patient representative. Informed consent was obtained from the patient/patient representative after explaining the indication, nature, and risks of the procedure including but not limited to death, bleeding, perforation, missed neoplasm/lesions, cardiorespiratory  compromise, and reaction to medications. Opportunity for questions was given and appropriate answers were provided. Patient/patient representative has verbalized understanding is amenable to undergoing the procedure.   Annamaria Helling, DO  [facility]  Portions of the record may have been created with voice recognition software. Occasional wrong-word or 'sound-a-like' substitutions may have occurred due to the inherent limitations of voice recognition software.  Read the chart carefully and recognize, using context, where substitutions may have occurred.

## 2021-03-09 ENCOUNTER — Ambulatory Visit: Payer: BC Managed Care – PPO | Admitting: Registered Nurse

## 2021-03-09 ENCOUNTER — Other Ambulatory Visit: Payer: Self-pay

## 2021-03-09 ENCOUNTER — Encounter
Admission: RE | Disposition: A | Discharge status: Home / Self Care | Payer: Self-pay | Source: Home / Self Care | Attending: Gastroenterology

## 2021-03-09 ENCOUNTER — Ambulatory Visit
Admission: RE | Admit: 2021-03-09 | Discharge: 2021-03-09 | Disposition: A | Discharge status: Home / Self Care | Payer: BC Managed Care – PPO | Attending: Gastroenterology | Admitting: Gastroenterology

## 2021-03-09 ENCOUNTER — Encounter: Payer: Self-pay | Admitting: Gastroenterology

## 2021-03-09 DIAGNOSIS — Z6841 Body Mass Index (BMI) 40.0 and over, adult: Secondary | ICD-10-CM | POA: Insufficient documentation

## 2021-03-09 DIAGNOSIS — I129 Hypertensive chronic kidney disease with stage 1 through stage 4 chronic kidney disease, or unspecified chronic kidney disease: Secondary | ICD-10-CM | POA: Insufficient documentation

## 2021-03-09 DIAGNOSIS — N189 Chronic kidney disease, unspecified: Secondary | ICD-10-CM | POA: Diagnosis not present

## 2021-03-09 DIAGNOSIS — Z1211 Encounter for screening for malignant neoplasm of colon: Secondary | ICD-10-CM | POA: Insufficient documentation

## 2021-03-09 DIAGNOSIS — K5730 Diverticulosis of large intestine without perforation or abscess without bleeding: Secondary | ICD-10-CM | POA: Insufficient documentation

## 2021-03-09 DIAGNOSIS — D125 Benign neoplasm of sigmoid colon: Secondary | ICD-10-CM | POA: Insufficient documentation

## 2021-03-09 DIAGNOSIS — Z87891 Personal history of nicotine dependence: Secondary | ICD-10-CM | POA: Diagnosis not present

## 2021-03-09 DIAGNOSIS — Z800 Family history of malignant neoplasm of digestive organs: Secondary | ICD-10-CM | POA: Diagnosis not present

## 2021-03-09 HISTORY — DX: Other specified disorders of kidney and ureter: N28.89

## 2021-03-09 HISTORY — DX: Hyperlipidemia, unspecified: E78.5

## 2021-03-09 HISTORY — DX: Chronic kidney disease, unspecified: N18.9

## 2021-03-09 HISTORY — DX: Personal history of urinary calculi: Z87.442

## 2021-03-09 HISTORY — PX: COLONOSCOPY WITH PROPOFOL: SHX5780

## 2021-03-09 HISTORY — DX: Prediabetes: R73.03

## 2021-03-09 SURGERY — COLONOSCOPY WITH PROPOFOL
Anesthesia: General

## 2021-03-09 MED ORDER — SODIUM CHLORIDE 0.9 % IV SOLN: INTRAVENOUS | Status: DC

## 2021-03-09 MED ORDER — PHENYLEPHRINE HCL-NACL 20-0.9 MG/250ML-% IV SOLN
INTRAVENOUS | Status: AC
  Filled 2021-03-09: qty 250

## 2021-03-09 MED ORDER — LIDOCAINE HCL (PF) 2 % IJ SOLN
INTRAMUSCULAR | Status: AC
  Filled 2021-03-09: qty 20

## 2021-03-09 MED ORDER — PROPOFOL 500 MG/50ML IV EMUL
INTRAVENOUS | Status: AC
  Filled 2021-03-09: qty 50

## 2021-03-09 MED ORDER — DEXMEDETOMIDINE HCL IN NACL 200 MCG/50ML IV SOLN
INTRAVENOUS | Status: DC | PRN
  Administered 2021-03-09: 12 ug via INTRAVENOUS

## 2021-03-09 MED ORDER — PROPOFOL 10 MG/ML IV BOLUS
INTRAVENOUS | Status: DC | PRN
  Administered 2021-03-09: 100 mg via INTRAVENOUS
  Administered 2021-03-09: 20 mg via INTRAVENOUS

## 2021-03-09 MED ORDER — DEXMEDETOMIDINE HCL IN NACL 80 MCG/20ML IV SOLN
INTRAVENOUS | Status: AC
  Filled 2021-03-09: qty 20

## 2021-03-09 MED ORDER — LIDOCAINE HCL (CARDIAC) PF 100 MG/5ML IV SOSY
PREFILLED_SYRINGE | INTRAVENOUS | Status: DC | PRN
  Administered 2021-03-09: 100 mg via INTRAVENOUS

## 2021-03-09 MED ORDER — PROPOFOL 500 MG/50ML IV EMUL
INTRAVENOUS | Status: DC | PRN
Start: 2021-03-09 — End: 2021-03-09
  Administered 2021-03-09: 150 ug/kg/min via INTRAVENOUS

## 2021-03-09 NOTE — Op Note (Signed)
[facility] Gastroenterology Patient Name: Sherri Forbes Procedure Date: 03/09/2021 7:06 AM MRN: [id number] Account #: [id number] Date of Birth: 02/14/1970 Admit Type: Outpatient Age: 51 Room: [facility] ENDO ROOM 2 Gender: Female Note Status: Finalized Instrument Name: Park Meo [id number] Procedure:             Colonoscopy Indications:           Screening in patient at increased risk: Family history                         of 1st-degree relative with colorectal cancer Providers:             Annamaria Helling DO, DO Referring MD:          Rubbie Battiest. Iona Beard MD, MD (Referring MD) Medicines:             Monitored Anesthesia Care Complications:         No immediate complications. Estimated blood loss:                         Minimal. Procedure:             Pre-Anesthesia Assessment:                        - Prior to the procedure, a History and Physical was                         performed, and patient medications and allergies were                         reviewed. The patient is competent. The risks and                         benefits of the procedure and the sedation options and                         risks were discussed with the patient. All questions                         were answered and informed consent was obtained.                         Patient identification and proposed procedure were                         verified by the physician, the nurse, the anesthetist                         and the technician in the endoscopy suite. Mental                         Status Examination: alert and oriented. Airway                         Examination: normal oropharyngeal airway and neck                         mobility. Respiratory Examination: clear to  auscultation. CV Examination: RRR, no murmurs, no S3                         or S4. Prophylactic Antibiotics: The patient does not                         require prophylactic antibiotics.  Prior                         Anticoagulants: The patient has taken no previous                         anticoagulant or antiplatelet agents. ASA Grade                         Assessment: III - A patient with severe systemic                         disease. After reviewing the risks and benefits, the                         patient was deemed in satisfactory condition to                         undergo the procedure. The anesthesia plan was to use                         monitored anesthesia care (MAC). Immediately prior to                         administration of medications, the patient was                         re-assessed for adequacy to receive sedatives. The                         heart rate, respiratory rate, oxygen saturations,                         blood pressure, adequacy of pulmonary ventilation, and                         response to care were monitored throughout the                         procedure. The physical status of the patient was                         re-assessed after the procedure.                        After obtaining informed consent, the colonoscope was                         passed under direct vision. Throughout the procedure,                         the patient's blood pressure, pulse, and oxygen  saturations were monitored continuously. The                         Colonoscope was introduced through the anus and                         advanced to the the terminal ileum, with                         identification of the appendiceal orifice and IC                         valve. The colonoscopy was performed without                         difficulty. The patient tolerated the procedure well.                         The quality of the bowel preparation was evaluated                         using the BBPS [facility] Bowel Preparation Scale) with                         scores of: Right Colon = 3, Transverse Colon = 3 and                          Left Colon = 3 (entire mucosa seen well with no                         residual staining, small fragments of stool or opaque                         liquid). The total BBPS score equals 9. The ileocecal                         valve, appendiceal orifice, and rectum were                         photographed. Findings:      The perianal and digital rectal examinations were normal. Pertinent       negatives include normal sphincter tone.      A 1 to 2 mm polyp was found in the sigmoid colon. The polyp was sessile.       The polyp was removed with a cold biopsy forceps. Resection and       retrieval were complete. Estimated blood loss was minimal.      A few small-mouthed diverticula were found in the recto-sigmoid colon.       Estimated blood loss: none.      The exam was otherwise without abnormality on direct and retroflexion       views. Impression:            - One 1 to 2 mm polyp in the sigmoid colon, removed                         with a cold biopsy forceps. Resected and retrieved.                        -  Diverticulosis in the recto-sigmoid colon.                        - The examination was otherwise normal on direct and                         retroflexion views. Recommendation:        - Discharge patient to home.                        - Resume previous diet.                        - Continue present medications.                        - Await pathology results.                        - Repeat colonoscopy for surveillance based on                         pathology results, at most 5 years.                        - Return to referring physician as previously                         scheduled.                        - No other screening modalities (ie- Cologuard, FOBT)                         as this patient has family history of colorectal                         cancer.                        - The findings and recommendations were discussed with                          the patient. Procedure Code(s):     --- Professional ---                        [phone], Colonoscopy, flexible; with biopsy, single or                         multiple Diagnosis Code(s):     --- Professional ---                        Z80.0, Family history of malignant neoplasm of                         digestive organs                        K63.5, Polyp of colon                        K57.30, Diverticulosis of  large intestine without                         perforation or abscess without bleeding CPT copyright 2019 American Medical Association. All rights reserved. The codes documented in this report are preliminary and upon coder review may  be revised to meet current compliance requirements. Attending Participation:      I personally performed the entire procedure. Volney American, DO Annamaria Helling DO, DO 03/09/2021 8:11:17 AM This report has been signed electronically. Number of Addenda: 0 Note Initiated On: 03/09/2021 7:06 AM Scope Withdrawal Time: 0 hours 14 minutes 50 seconds  Total Procedure Duration: 0 hours 29 minutes 58 seconds  Estimated Blood Loss:  Estimated blood loss was minimal.      [facility]

## 2021-03-09 NOTE — Anesthesia Postprocedure Evaluation (Signed)
Anesthesia Post Note  Patient: Sherri Forbes  Procedure(s) Performed: COLONOSCOPY WITH PROPOFOL  Patient location during evaluation: Endoscopy Anesthesia Type: General Level of consciousness: awake and alert Pain management: pain level controlled Vital Signs Assessment: post-procedure vital signs reviewed and stable Respiratory status: spontaneous breathing, nonlabored ventilation, respiratory function stable and patient connected to nasal cannula oxygen Cardiovascular status: blood pressure returned to baseline and stable Postop Assessment: no apparent nausea or vomiting Anesthetic complications: no   No notable events documented.   Last Vitals:  Vitals:   03/09/21 0830 03/09/21 0840  BP: 110/62 111/62  Pulse: 81 81  Resp: 18 17  Temp:    SpO2: 92% 96%    Last Pain:  Vitals:   03/09/21 0700  TempSrc: Temporal  PainSc: 0-No pain                 Martha Clan

## 2021-03-09 NOTE — Interval H&P Note (Signed)
History and Physical Interval Note: Preprocedure H&P from 03/09/21  was reviewed and there was no interval change after seeing and examining the patient.  Written consent was obtained from the patient after discussion of risks, benefits, and alternatives. Patient has consented to proceed with Colonoscopy with possible intervention   03/09/2021 7:25 AM  Sherri Forbes  has presented today for surgery, with the diagnosis of family history colon cancer father.  The various methods of treatment have been discussed with the patient and family. After consideration of risks, benefits and other options for treatment, the patient has consented to  Procedure(s): COLONOSCOPY WITH PROPOFOL (N/A) as a surgical intervention.  The patient's history has been reviewed, patient examined, no change in status, stable for surgery.  I have reviewed the patient's chart and labs.  Questions were answered to the patient's satisfaction.     Annamaria Helling

## 2021-03-09 NOTE — Transfer of Care (Signed)
Immediate Anesthesia Transfer of Care Note  Patient: Sherri Forbes  Procedure(s) Performed: COLONOSCOPY WITH PROPOFOL  Patient Location: PACU on Endo Unit  Anesthesia Type:General  Level of Consciousness: drowsy  Airway & Oxygen Therapy: Patient Spontanous Breathing and Patient connected to nasal cannula oxygen  Post-op Assessment: Report given to RN and Post -op Vital signs reviewed and stable  Post vital signs: Reviewed and stable  Last Vitals:  Vitals Value Taken Time  BP    Temp    Pulse 93 03/09/21 0808  Resp 12 03/09/21 0808  SpO2 90 % 03/09/21 0808  Vitals shown include unvalidated device data.  Last Pain:  Vitals:   03/09/21 0700  TempSrc: Temporal  PainSc: 0-No pain         Complications: No notable events documented.

## 2021-03-09 NOTE — Anesthesia Preprocedure Evaluation (Signed)
Anesthesia Evaluation  Patient identified by MRN, date of birth, ID band Patient awake    Reviewed: Allergy & Precautions, H&P , NPO status , Patient's Chart, lab work & pertinent test results, reviewed documented beta blocker date and time   History of Anesthesia Complications Negative for: history of anesthetic complications  Airway Mallampati: II  TM Distance: >3 FB Neck ROM: full    Dental  (+) Dental Advidsory Given, Missing, Teeth Intact, Caps   Pulmonary neg pulmonary ROS, former smoker,           Cardiovascular Exercise Tolerance: Good hypertension, (-) angina(-) CAD, (-) Past MI, (-) Cardiac Stents and (-) CABG (-) dysrhythmias (-) Valvular Problems/Murmurs     Neuro/Psych negative neurological ROS  negative psych ROS   GI/Hepatic negative GI ROS, Neg liver ROS,   Endo/Other  neg diabetesMorbid obesity  Renal/GU Renal disease (kidney stones)  negative genitourinary   Musculoskeletal   Abdominal   Peds  Hematology negative hematology ROS (+)   Anesthesia Other Findings Past Medical History: No date: Hypertension No date: Kidney stone   Reproductive/Obstetrics negative OB ROS                             Anesthesia Physical  Anesthesia Plan  ASA: 3  Anesthesia Plan: General   Post-op Pain Management:    Induction: Intravenous  PONV Risk Score and Plan: 3 and Propofol infusion and TIVA  Airway Management Planned: Natural Airway and Nasal Cannula  Additional Equipment:   Intra-op Plan:   Post-operative Plan:   Informed Consent: I have reviewed the patients History and Physical, chart, labs and discussed the procedure including the risks, benefits and alternatives for the proposed anesthesia with the patient or authorized representative who has indicated his/her understanding and acceptance.     Dental Advisory Given  Plan Discussed with: Anesthesiologist, CRNA and  Surgeon  Anesthesia Plan Comments:         Anesthesia Quick Evaluation

## 2021-03-10 LAB — SURGICAL PATHOLOGY

## 2021-06-25 ENCOUNTER — Other Ambulatory Visit: Payer: Self-pay | Admitting: Family Medicine

## 2021-06-25 DIAGNOSIS — Z1231 Encounter for screening mammogram for malignant neoplasm of breast: Secondary | ICD-10-CM

## 2021-08-04 ENCOUNTER — Ambulatory Visit
Admission: RE | Admit: 2021-08-04 | Discharge: 2021-08-04 | Disposition: A | Discharge status: Home / Self Care | Payer: BC Managed Care – PPO | Source: Ambulatory Visit | Attending: Family Medicine | Admitting: Family Medicine

## 2021-08-04 DIAGNOSIS — Z1231 Encounter for screening mammogram for malignant neoplasm of breast: Secondary | ICD-10-CM | POA: Insufficient documentation

## 2022-05-16 ENCOUNTER — Other Ambulatory Visit: Payer: Self-pay

## 2022-05-16 ENCOUNTER — Emergency Department
Admission: EM | Admit: 2022-05-16 | Discharge: 2022-05-16 | Disposition: A | Discharge status: Home / Self Care | Payer: BC Managed Care – PPO | Attending: Orthopedic Surgery | Admitting: Orthopedic Surgery

## 2022-05-16 DIAGNOSIS — S60461A Insect bite (nonvenomous) of left index finger, initial encounter: Secondary | ICD-10-CM | POA: Diagnosis present

## 2022-05-16 DIAGNOSIS — W57XXXA Bitten or stung by nonvenomous insect and other nonvenomous arthropods, initial encounter: Secondary | ICD-10-CM | POA: Diagnosis not present

## 2022-05-16 MED ORDER — CEPHALEXIN 500 MG PO CAPS: 500.0000 mg | ORAL_CAPSULE | Freq: Four times a day (QID) | ORAL | 0 refills | Status: AC

## 2022-05-16 NOTE — ED Provider Notes (Signed)
[place] EMERGENCY DEPARTMENT AT [facility] Provider Note   CSN: [id number] Arrival date & time: 05/16/22  2130     History  Chief Complaint  Patient presents with   Insect Bite    Sherri Forbes is a 52 y.o. female presents to the emergency department for evaluation of left index finger bite.  She was working on a deck outside, felt something bite her but uncertain what it was.  On the dorsal aspect of her left proximal phalanx she has an area of erythema with swelling.  This is also caused some surrounding swelling to the level of the mid metacarpals with very mild redness.  There is been no drainage.  Pain is moderate.  She denies any fevers, chills, pus.  HPI     Home Medications Prior to Admission medications   Medication Sig Start Date End Date Taking? Authorizing Provider  cephALEXin (KEFLEX) 500 MG capsule Take 1 capsule (500 mg total) by mouth 4 (four) times daily for 10 days. 05/16/22 05/26/22 Yes Evon Slack, PA-C  benazepril (LOTENSIN) 20 MG tablet Take 20 mg daily by mouth.    [provider]  Cholecalciferol (VITAMIN D3) 10 MCG (400 UNIT) CAPS Take by mouth. Patient not taking: Reported on 03/09/2021    [provider]  methenamine (HIPREX) 1 g tablet Take 1 g by mouth 2 (two) times daily with a meal.    [provider]  oxybutynin (DITROPAN-XL) 5 MG 24 hr tablet Take 5 mg by mouth at bedtime. Patient not taking: Reported on 03/09/2021    [provider]  PARoxetine (PAXIL) 10 MG tablet Take 10 mg daily by mouth.    [provider]  simvastatin (ZOCOR) 20 MG tablet Take 20 mg by mouth at bedtime. 07/23/20   [provider]  vitamin B-12 (CYANOCOBALAMIN) 1000 MCG tablet Take 1,000 mcg by mouth daily. Patient not taking: Reported on 03/09/2021    [provider]      Allergies    Penicillins and Sulfa antibiotics    Review of Systems   Review of Systems  Physical Exam Updated Vital  Signs BP (!) 164/84   Pulse (!) 102   Temp 98.4 F (36.9 C) (Oral)   Resp 18   Ht 5\' 4"  (1.626 m)   Wt 113.4 kg   SpO2 98%   BMI 42.91 kg/m  Physical Exam Constitutional:      Appearance: She is well-developed.  HENT:     Head: Normocephalic and atraumatic.  Eyes:     Conjunctiva/sclera: Conjunctivae normal.  Cardiovascular:     Rate and Rhythm: Normal rate.  Pulmonary:     Effort: Pulmonary effort is normal. No respiratory distress.  Musculoskeletal:        General: Normal range of motion.     Cervical back: Normal range of motion.     Comments: Left index finger swollen along the proximal phalanx with a 2 x 2 centimeter area of erythema, no fluctuance.  Central wound along the area of erythema, this is D removed with a 25-gauge needle and clear fluid expressed.  There is mild surrounding erythema extending to the dorsal aspect of the hand on the mid metacarpals, this is nontender.  She has full composite fist.  She has full active extension.  Skin:    General: Skin is warm.     Findings: No rash.  Neurological:     General: No focal deficit present.     Mental Status: She  is alert and oriented to person, place, and time.  Psychiatric:        Behavior: Behavior normal.        Thought Content: Thought content normal.     ED Results / Procedures / Treatments   Labs (all labs ordered are listed, but only abnormal results are displayed) Labs Reviewed - No data to display  EKG None  Radiology No results found.  Procedures Procedures  Dorsal aspect of the left proximal phalanx is prepped with alcohol and then D removed with a 25-gauge needle.  A few cc of clear fluid expressed from the bite site.  No fluctuance, no pus.  Band-Aid was applied.  Medications Ordered in ED Medications - No data to display  ED Course/ Medical Decision Making/ A&P                             Medical Decision Making Risk Prescription drug management.   52 year old female with  insect bite to the dorsal aspect of the left index finger.  Very mild cellulitis mostly inflammatory response from the bite.  Will place on cephalexin due to allergies to penicillin and sulfa.  She has tolerated cephalexin well in the past.  She is educated on soaking the finger 3 times a day and also applying a small amount of antibiotic ointment to the wound.  She understands signs symptoms return to the ER for. Final Clinical Impression(s) / ED Diagnoses Final diagnoses:  Insect bite of left index finger, initial encounter    Rx / DC Orders ED Discharge Orders          Ordered    cephALEXin (KEFLEX) 500 MG capsule  4 times daily        05/16/22 0734              Evon Slack, PA-C 05/16/22 0741    Willy Eddy, MD 05/16/22 1026

## 2022-05-16 NOTE — ED Triage Notes (Addendum)
Pt to ED via POV c/o possible insect bite. Pt states she was outside working on thursday when she felt pain to pointer finger of left hand. Pt has some swelling and redness to bite but no streaking. Pt has been using cortisone cream on it.

## 2022-05-16 NOTE — Discharge Instructions (Signed)
Please soak left index finger and hydrogen peroxide and tap water for 5 minutes 3 times a day.  Take antibiotic as prescribed.  You may apply thin layer of antibiotic ointment daily.  Return to the ER for any increasing pain swelling warmth redness or fevers

## 2022-06-15 ENCOUNTER — Other Ambulatory Visit: Payer: Self-pay | Admitting: Family Medicine

## 2022-06-15 DIAGNOSIS — Z1231 Encounter for screening mammogram for malignant neoplasm of breast: Secondary | ICD-10-CM

## 2022-08-09 ENCOUNTER — Ambulatory Visit
Admission: RE | Admit: 2022-08-09 | Discharge: 2022-08-09 | Disposition: A | Discharge status: Home / Self Care | Payer: BC Managed Care – PPO | Source: Ambulatory Visit | Attending: Family Medicine | Admitting: Family Medicine

## 2022-08-09 DIAGNOSIS — Z1231 Encounter for screening mammogram for malignant neoplasm of breast: Secondary | ICD-10-CM | POA: Diagnosis not present

## 2023-02-11 IMAGING — MG MM DIGITAL SCREENING BILAT W/ TOMO AND CAD
8 series · 8 of 24 positions shown · non-contrast
Comparison: Previous exam(s).

CLINICAL DATA: Screening.

EXAM:
DIGITAL SCREENING BILATERAL MAMMOGRAM WITH TOMOSYNTHESIS AND CAD
TECHNIQUE: Bilateral screening digital craniocaudal and mediolateral oblique
mammograms were obtained. Bilateral screening digital breast
tomosynthesis was performed. The images were evaluated with
computer-aided detection.

[R CC synth-2D]
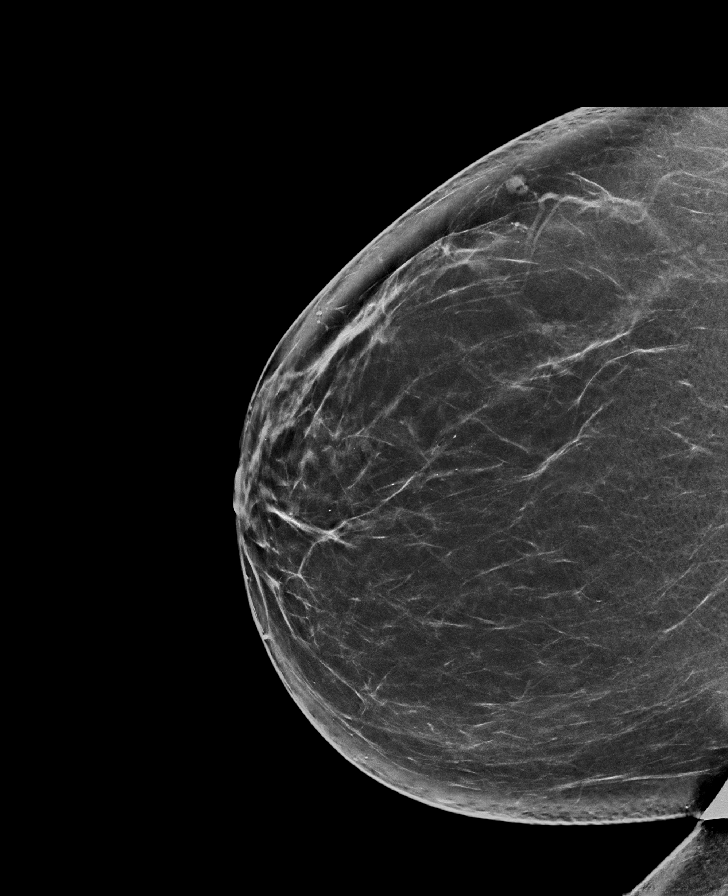

[R MLO synth-2D]
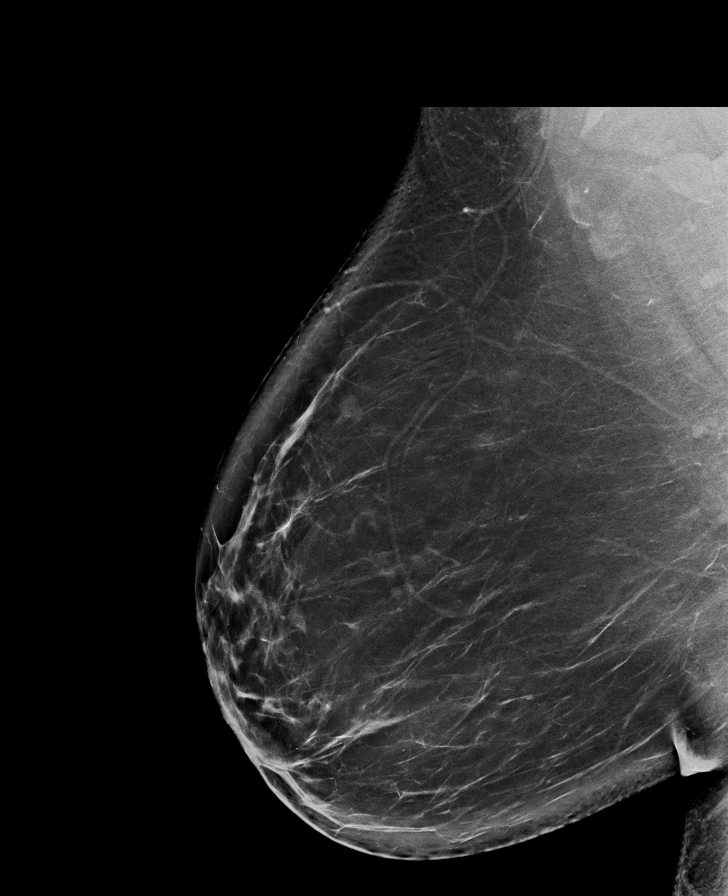

[L MLO synth-2D]
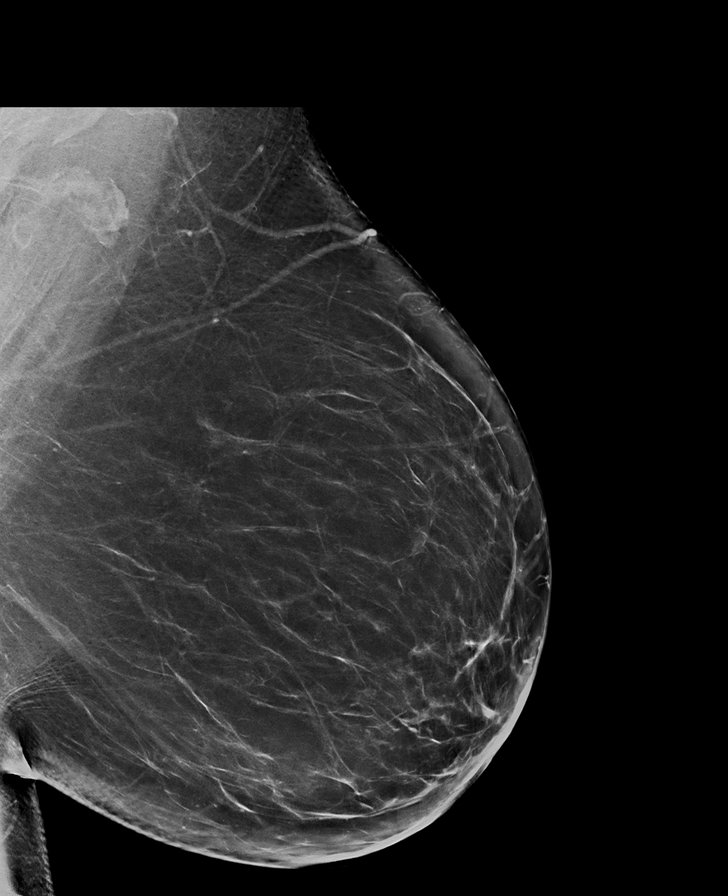

[L CC synth-2D]
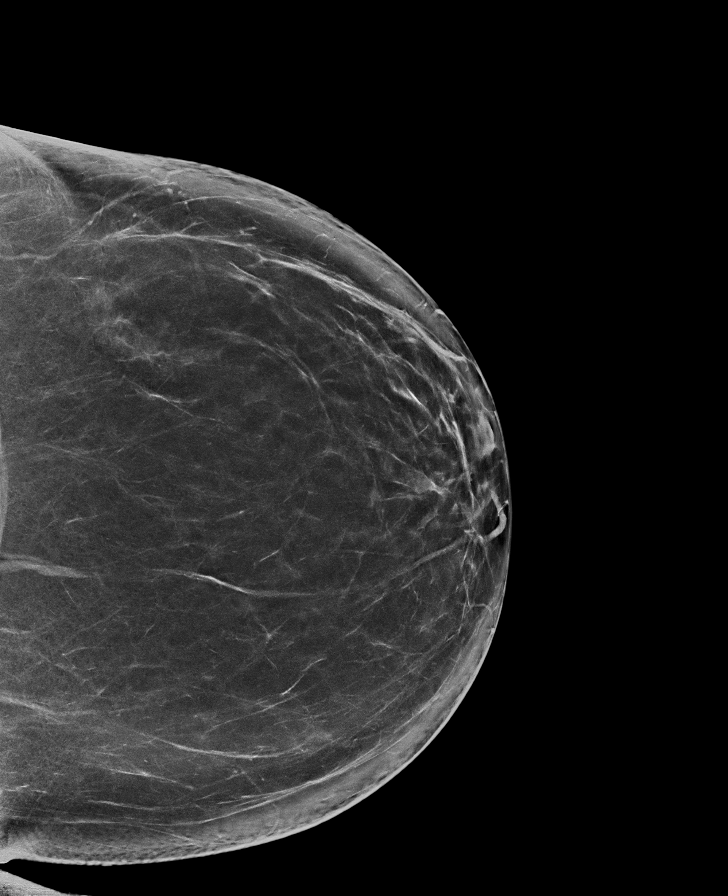

[R MLO tomo · tomo slice 54/107.0]
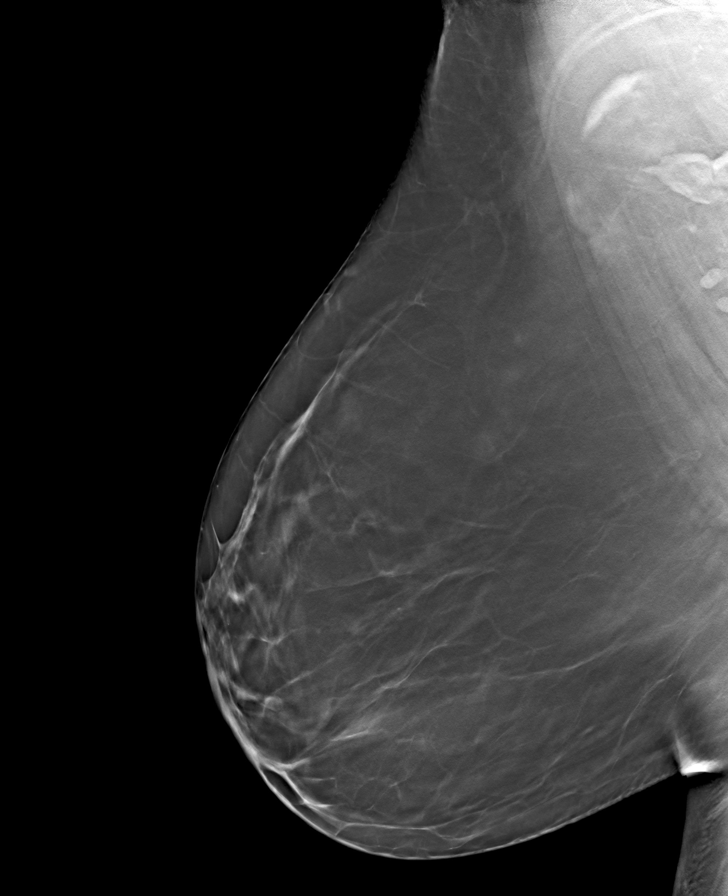

[L CC tomo · tomo slice 47/93.0]
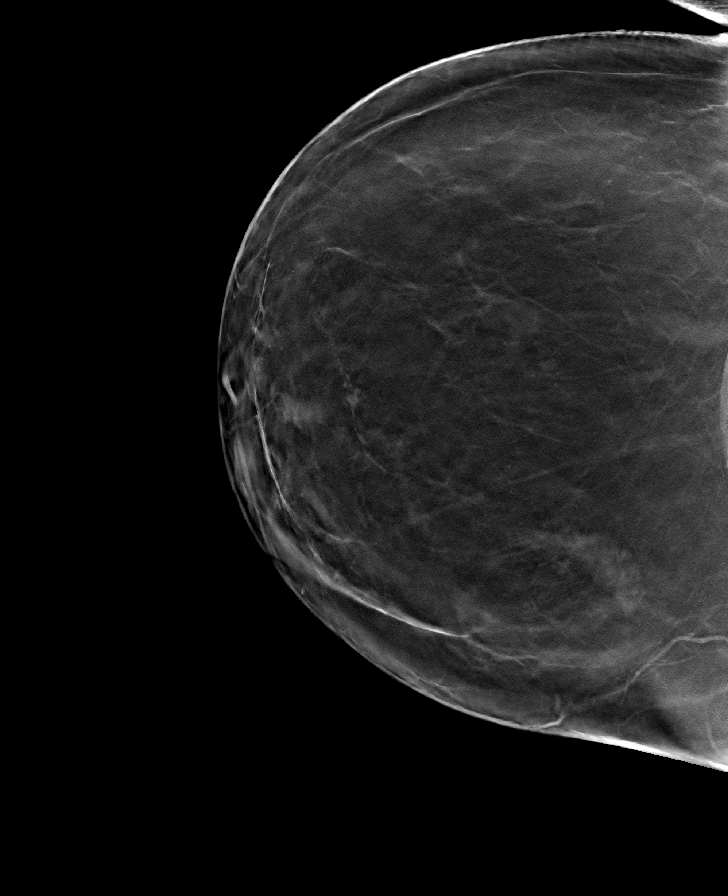

[R CC tomo · tomo slice 43/86.0]
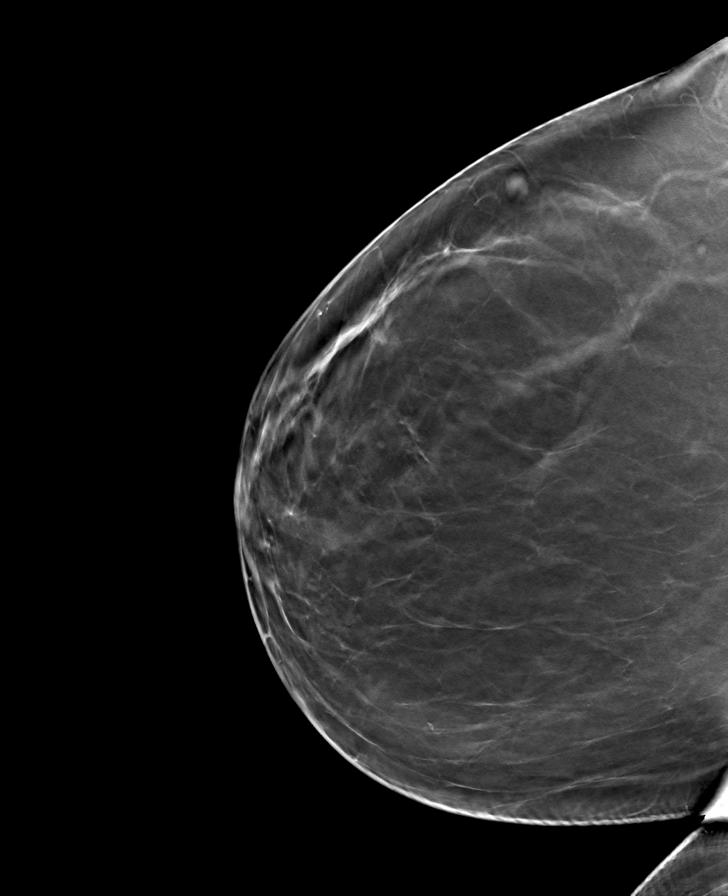

[L MLO tomo · tomo slice 52/103.0]
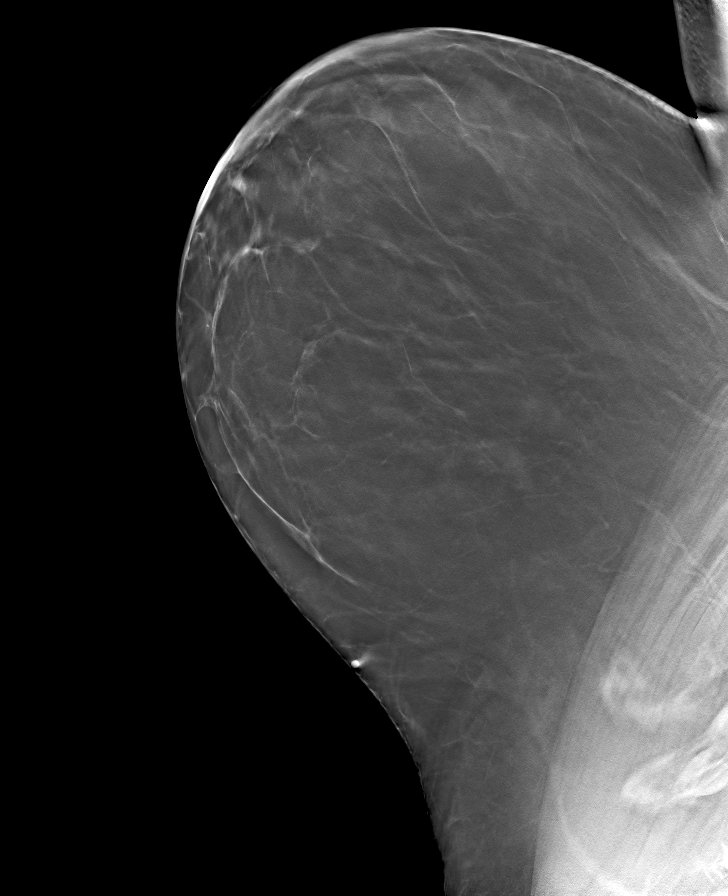

[8 of 24 positions shown; findings below may reference images not displayed]

ACR Breast Density Category b: There are scattered areas of
fibroglandular density.
FINDINGS: There are no findings suspicious for malignancy.
IMPRESSION: No mammographic evidence of malignancy. A result letter of this
screening mammogram will be mailed directly to the patient.

RECOMMENDATION:
Screening mammogram in one year. (Code:51-O-LD2)

BI-RADS CATEGORY  1: Negative.

## 2023-03-04 IMAGING — CR DG ABDOMEN 1V
2 series · 2 of 2 positions shown · non-contrast
Comparison: CT abdomen and pelvis 11/23/2016.

CLINICAL DATA: Fever, back and right flank pain.

EXAM:
ABDOMEN - 1 VIEW

[abdomen kub (1 of 2)]
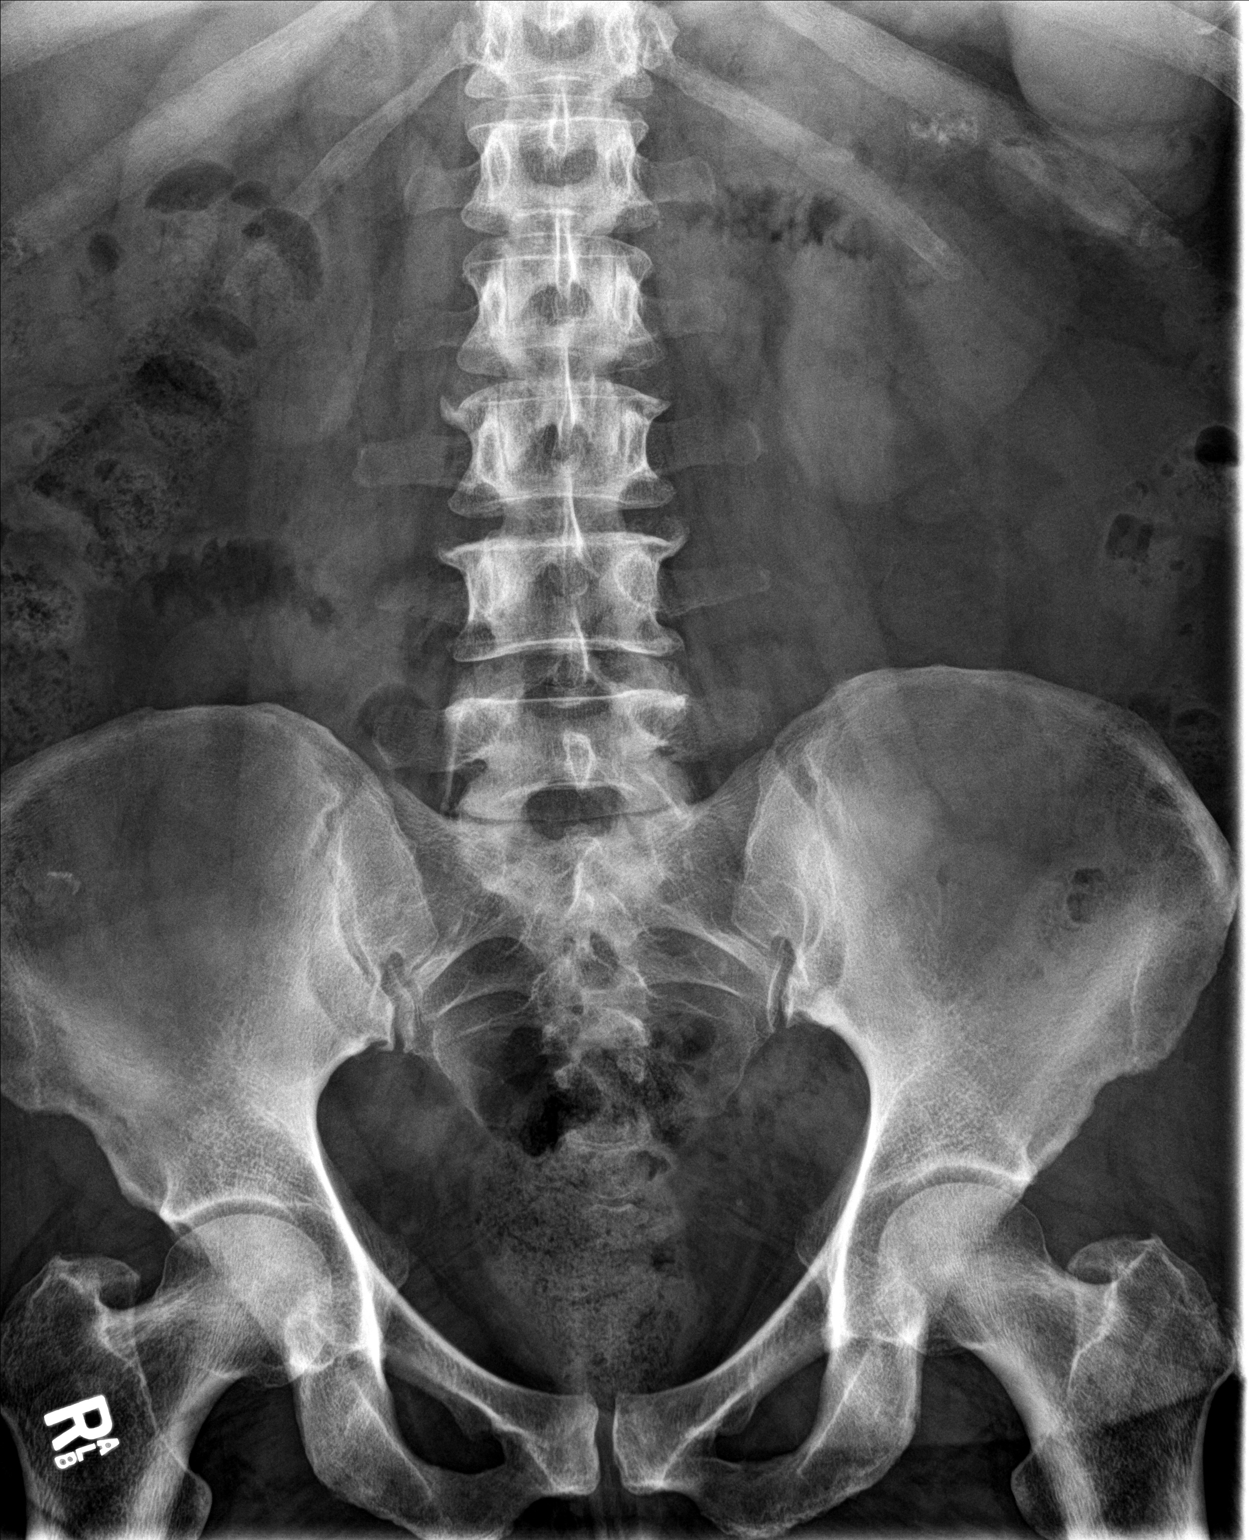

[abdomen kub (2 of 2)]
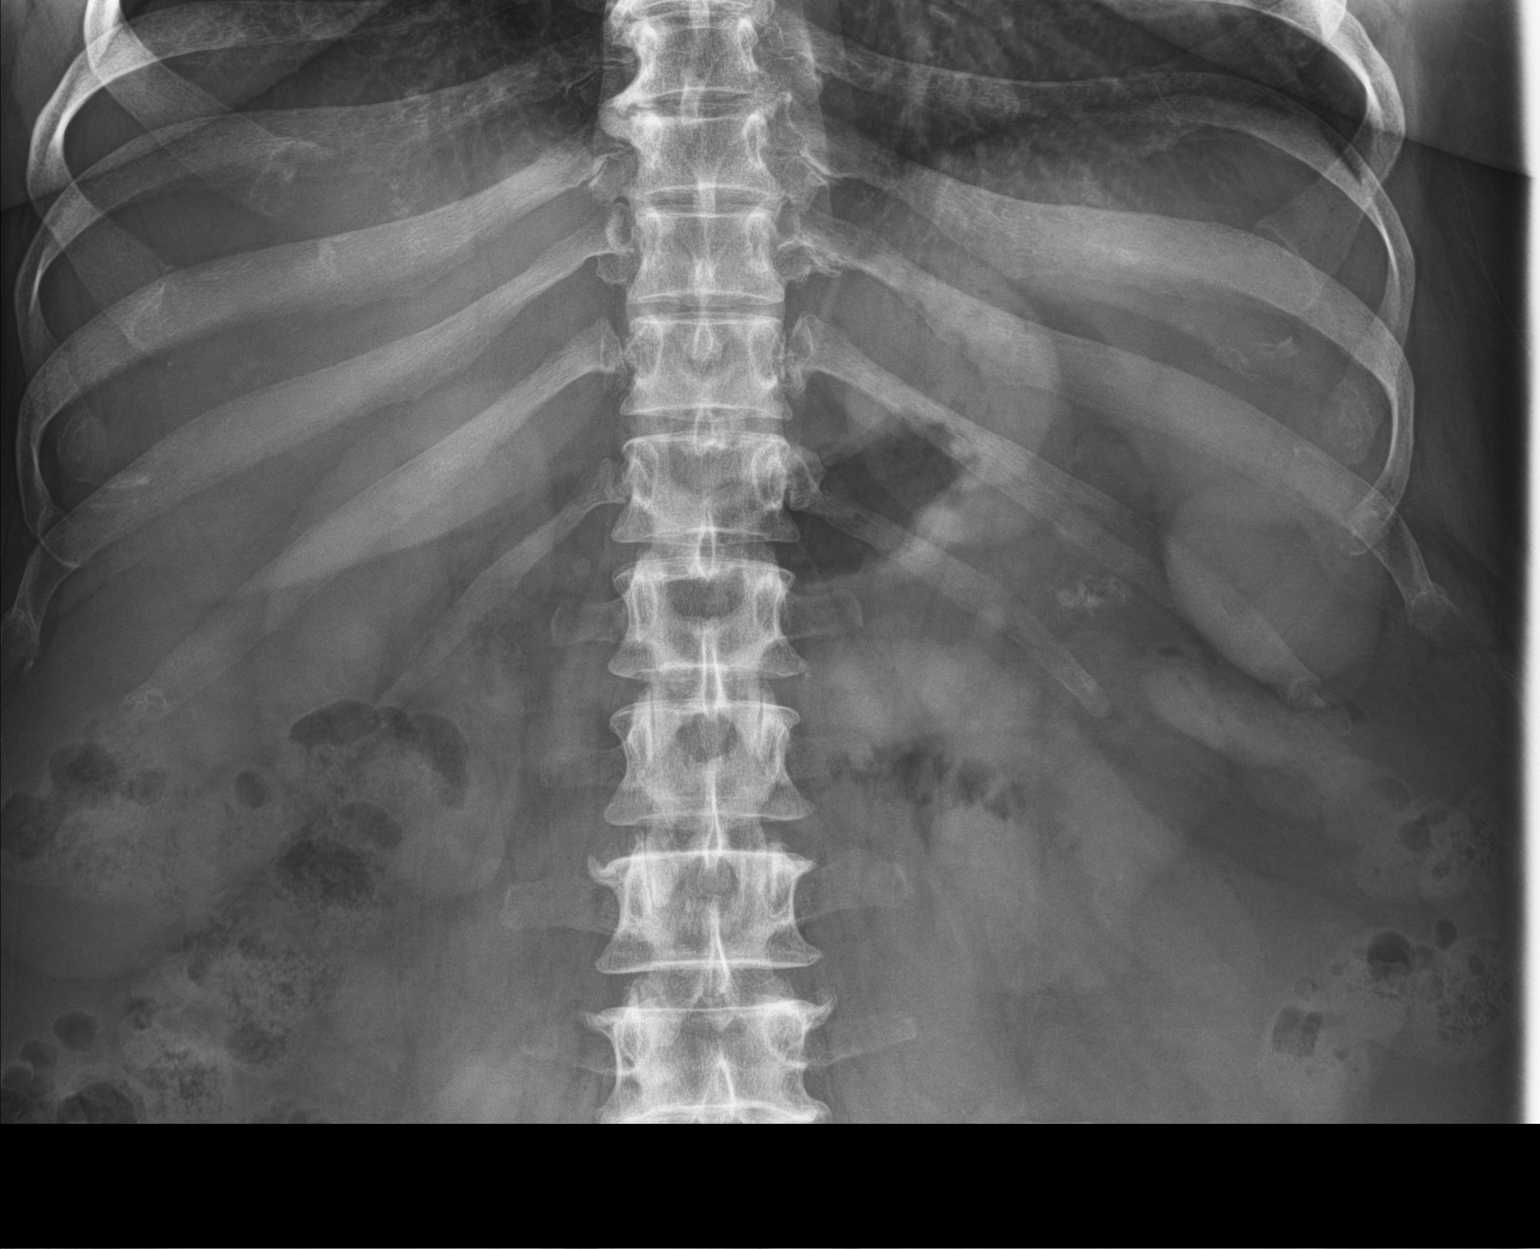

[2 of 2 positions shown; findings below may reference images not displayed]

FINDINGS: The bowel gas pattern is normal. No radio-opaque calculi or other
significant radiographic abnormality are seen. Calcification in the
left kidney correlating with partially calcified scar as seen on
prior CT noted.
IMPRESSION: No acute abnormality or finding to explain the patient's symptoms.
Negative for urinary tract stones.

Partially calcified scar upper pole left kidney is unchanged.

## 2023-07-15 ENCOUNTER — Other Ambulatory Visit: Payer: Self-pay | Admitting: Family Medicine

## 2023-07-15 DIAGNOSIS — Z1231 Encounter for screening mammogram for malignant neoplasm of breast: Secondary | ICD-10-CM

## 2023-08-10 ENCOUNTER — Other Ambulatory Visit: Payer: Self-pay | Admitting: Family Medicine

## 2023-08-10 ENCOUNTER — Ambulatory Visit
Admission: RE | Admit: 2023-08-10 | Discharge: 2023-08-10 | Disposition: A | Discharge status: Home / Self Care | Source: Ambulatory Visit | Attending: Family Medicine | Admitting: Family Medicine

## 2023-08-10 DIAGNOSIS — N6323 Unspecified lump in the left breast, lower outer quadrant: Secondary | ICD-10-CM

## 2023-08-10 DIAGNOSIS — Z1231 Encounter for screening mammogram for malignant neoplasm of breast: Secondary | ICD-10-CM | POA: Insufficient documentation

## 2023-08-10 DIAGNOSIS — N6321 Unspecified lump in the left breast, upper outer quadrant: Secondary | ICD-10-CM

## 2023-08-10 DIAGNOSIS — N6325 Unspecified lump in the left breast, overlapping quadrants: Secondary | ICD-10-CM

## 2023-08-12 ENCOUNTER — Ambulatory Visit
Admission: RE | Admit: 2023-08-12 | Discharge: 2023-08-12 | Disposition: A | Discharge status: Home / Self Care | Source: Ambulatory Visit | Attending: Family Medicine | Admitting: Family Medicine

## 2023-08-12 DIAGNOSIS — N6325 Unspecified lump in the left breast, overlapping quadrants: Secondary | ICD-10-CM | POA: Insufficient documentation

## 2023-08-12 DIAGNOSIS — N6321 Unspecified lump in the left breast, upper outer quadrant: Secondary | ICD-10-CM | POA: Diagnosis present

## 2023-08-12 DIAGNOSIS — N6323 Unspecified lump in the left breast, lower outer quadrant: Secondary | ICD-10-CM | POA: Diagnosis present

## 2023-08-15 ENCOUNTER — Encounter: Payer: Self-pay | Admitting: Family Medicine

## 2023-08-17 ENCOUNTER — Other Ambulatory Visit: Payer: Self-pay | Admitting: Family Medicine

## 2023-08-17 DIAGNOSIS — R928 Other abnormal and inconclusive findings on diagnostic imaging of breast: Secondary | ICD-10-CM

## 2023-08-25 ENCOUNTER — Ambulatory Visit
Admission: RE | Admit: 2023-08-25 | Discharge: 2023-08-25 | Disposition: A | Discharge status: Home / Self Care | Source: Ambulatory Visit | Attending: Family Medicine | Admitting: Family Medicine

## 2023-08-25 DIAGNOSIS — R928 Other abnormal and inconclusive findings on diagnostic imaging of breast: Secondary | ICD-10-CM

## 2023-08-25 DIAGNOSIS — D0511 Intraductal carcinoma in situ of right breast: Secondary | ICD-10-CM | POA: Insufficient documentation

## 2023-08-25 HISTORY — PX: BREAST BIOPSY: SHX20

## 2023-08-25 MED ORDER — LIDOCAINE 1 % OPTIME INJ - NO CHARGE
5.0000 mL | Freq: Once | INTRAMUSCULAR | Status: AC
  Administered 2023-08-25: 5 mL
  Filled 2023-08-25: qty 6

## 2023-08-25 MED ORDER — LIDOCAINE-EPINEPHRINE 1 %-1:100000 IJ SOLN
8.0000 mL | Freq: Once | INTRAMUSCULAR | Status: AC
  Administered 2023-08-25: 8 mL
  Filled 2023-08-25: qty 8

## 2023-08-25 MED ORDER — LIDOCAINE-EPINEPHRINE 1 %-1:100000 IJ SOLN
20.0000 mL | Freq: Once | INTRAMUSCULAR | Status: AC
  Administered 2023-08-25: 20 mL
  Filled 2023-08-25: qty 20

## 2023-08-25 MED ORDER — LIDOCAINE 1 % OPTIME INJ - NO CHARGE
2.0000 mL | Freq: Once | INTRAMUSCULAR | Status: AC
  Administered 2023-08-25: 2 mL
  Filled 2023-08-25: qty 2

## 2023-08-26 LAB — SURGICAL PATHOLOGY

## 2023-08-29 ENCOUNTER — Encounter: Payer: Self-pay | Admitting: *Deleted

## 2023-08-29 DIAGNOSIS — D0511 Intraductal carcinoma in situ of right breast: Secondary | ICD-10-CM

## 2023-08-29 NOTE — Progress Notes (Deleted)
 Ms. Herne would like to see Dr. Lane and Dr.

## 2023-08-29 NOTE — Progress Notes (Signed)
 Received referral for newly diagnosed breast cancer from [facility].  Navigation initiated.  Discussed surgeons and medical oncologists with Sherri Forbes.   She will call me back when she decides who she would like to see.

## 2023-08-30 NOTE — Progress Notes (Signed)
 Ms. Lawyer will see Dr. Babara on 8/20.  Referral has been placed for Dr. Lane, his office will call her with the appointment.

## 2023-09-07 ENCOUNTER — Inpatient Hospital Stay

## 2023-09-07 ENCOUNTER — Inpatient Hospital Stay: Attending: Oncology | Admitting: Oncology

## 2023-09-07 ENCOUNTER — Encounter: Payer: Self-pay | Admitting: Oncology

## 2023-09-07 ENCOUNTER — Encounter: Payer: Self-pay | Admitting: *Deleted

## 2023-09-07 VITALS — BP 136/65 | HR 98 | Temp 97.8°F | Resp 18 | Wt 238.1 lb

## 2023-09-07 DIAGNOSIS — Z809 Family history of malignant neoplasm, unspecified: Secondary | ICD-10-CM | POA: Insufficient documentation

## 2023-09-07 DIAGNOSIS — D0511 Intraductal carcinoma in situ of right breast: Secondary | ICD-10-CM | POA: Diagnosis not present

## 2023-09-07 DIAGNOSIS — Z801 Family history of malignant neoplasm of trachea, bronchus and lung: Secondary | ICD-10-CM | POA: Diagnosis not present

## 2023-09-07 DIAGNOSIS — Z170 Estrogen receptor positive status [ER+]: Secondary | ICD-10-CM | POA: Insufficient documentation

## 2023-09-07 DIAGNOSIS — Z87891 Personal history of nicotine dependence: Secondary | ICD-10-CM | POA: Insufficient documentation

## 2023-09-07 DIAGNOSIS — Z800 Family history of malignant neoplasm of digestive organs: Secondary | ICD-10-CM | POA: Diagnosis not present

## 2023-09-07 DIAGNOSIS — Z803 Family history of malignant neoplasm of breast: Secondary | ICD-10-CM | POA: Diagnosis not present

## 2023-09-07 DIAGNOSIS — Z808 Family history of malignant neoplasm of other organs or systems: Secondary | ICD-10-CM | POA: Insufficient documentation

## 2023-09-07 LAB — CBC WITH DIFFERENTIAL/PLATELET
Abs Immature Granulocytes: 0.03 K/uL (ref 0.00–0.07)
Basophils Absolute: 0.1 K/uL (ref 0.0–0.1)
Basophils Relative: 1 %
Eosinophils Absolute: 0.1 K/uL (ref 0.0–0.5)
Eosinophils Relative: 2 %
HCT: 43.6 % (ref 36.0–46.0)
Hemoglobin: 14.5 g/dL (ref 12.0–15.0)
Immature Granulocytes: 0 %
Lymphocytes Relative: 18 %
Lymphs Abs: 1.6 K/uL (ref 0.7–4.0)
MCH: 27.5 pg (ref 26.0–34.0)
MCHC: 33.3 g/dL (ref 30.0–36.0)
MCV: 82.7 fL (ref 80.0–100.0)
Monocytes Absolute: 0.6 K/uL (ref 0.1–1.0)
Monocytes Relative: 7 %
Neutro Abs: 6.2 K/uL (ref 1.7–7.7)
Neutrophils Relative %: 72 %
Platelets: 277 K/uL (ref 150–400)
RBC: 5.27 MIL/uL — ABNORMAL HIGH (ref 3.87–5.11)
RDW: 13.9 % (ref 11.5–15.5)
WBC: 8.6 K/uL (ref 4.0–10.5)
nRBC: 0 % (ref 0.0–0.2)

## 2023-09-07 LAB — COMPREHENSIVE METABOLIC PANEL WITH GFR
ALT: 35 U/L (ref 0–44)
AST: 31 U/L (ref 15–41)
Albumin: 4.8 g/dL (ref 3.5–5.0)
Alkaline Phosphatase: 70 U/L (ref 38–126)
Anion gap: 11 (ref 5–15)
BUN: 15 mg/dL (ref 6–20)
CO2: 23 mmol/L (ref 22–32)
Calcium: 9.7 mg/dL (ref 8.9–10.3)
Chloride: 101 mmol/L (ref 98–111)
Creatinine, Ser: 0.67 mg/dL (ref 0.44–1.00)
GFR, Estimated: >60 mL/min (ref >60)
Glucose, Bld: 117 mg/dL — ABNORMAL HIGH (ref 70–99)
Potassium: 4 mmol/L (ref 3.5–5.1)
Sodium: 135 mmol/L (ref 135–145)
Total Bilirubin: 0.9 mg/dL (ref 0.0–1.2)
Total Protein: 8 g/dL (ref 6.5–8.1)

## 2023-09-07 NOTE — Progress Notes (Signed)
 Accompanied patient and family to initial medical oncology appointment.   Reviewed Breast Cancer treatment handbook.   Care plan summary given to patient.   Reviewed outreach programs and cancer center services. She declines genetic testing at this time and would like to think about it.

## 2023-09-07 NOTE — Progress Notes (Addendum)
 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  We discussed that DCIS is a non invasive breast cancer,  cancer is only in the duct and has not spread into the tissues around it. Will check with radiology to see if right axillary lymph node was imaged. Recommend lumpectomy followed by adjuvant radiation and endocrine therapy.  Family history of cancer Recommend genetic counseling.  Patient declined   Orders Placed This Encounter  Procedures   Comprehensive metabolic panel with GFR    Standing Status:   Future    Number of Occurrences:   1    Expected Date:   09/07/2023    Expiration Date:   12/06/2023   CBC with Differential/Platelet    Standing Status:   Future    Number of Occurrences:   1    Expected Date:   09/07/2023    Expiration Date:   12/06/2023   Follow-up to be determined. All questions were answered. The patient knows to call the clinic with any problems, questions or concerns.  Zelphia Cap, MD, PhD [facility] Hematology Oncology 09/07/2023   HISTORY OF PRESENTING ILLNESS:   Sherri Forbes is a  53 y.o.  female with PMH listed below was seen in consultation at the request of  Zachary Idelia LABOR, MD  for evaluation of right breast DCIS.  Oncology History  Ductal carcinoma in situ (DCIS) of right breast  08/12/2023 Mammogram   Patient had felt a left breast nodule and underwent further workup. Bilateral diagnostic mammogram  1. There is a benign appearing intradermal mass at the site of palpable concern in the LEFT breast most consistent with an epidermal inclusion cyst. Recommend clinical management of this mass. 2.  There is approximately 5 cm of indeterminate calcifications in the RIGHT breast with a dominant 17 mm group in the RIGHT lower breast at anterior depth. There is a favored sonographic correlate for these anterior calcifications at 6 o'clock in the retroareolar breast. Recommend ultrasound-guided biopsy of these calcifications at 6 o'clock followed by stereotactic guided biopsy of a second representative area of calcifications at middle depth for definitive characterization. 3. No mammographic evidence of malignancy in the LEFT breast.   09/07/2023 Initial Diagnosis   Ductal carcinoma in situ (DCIS) of right breast  Patient underwent right breast ultrasound-guided biopsy as well as stereotactic biopsy.  Pathology showed  1. 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 PATRICK REVIEWED THE CASE AND CONCURS WITH THE INTERPRETATION.  A BREAST      PROGNOSTIC PROFILE ER 99% positive, PR 40% positive.       2. 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      -  NECROSIS: SINGLE CELL NECROSIS      - CALCIFICATIONS: PRESENT      - DCIS LENGTH: MULTIFOCAL FOCI EACH APPROXIMATELY 2 MM IN GREATEST LINEAR      DIMENSION PRESENT ON PARTS 2A, 2B, 2C AND 2D   Menarche at age of 21 First live birth at age of 48 OCP use: > 5 years History of hysterectomy: no Menopausal status: Postmenopausal History of HRT use: No Number of previous breast biopsies: No    09/07/2023 Cancer Staging   Staging form: Breast, AJCC 8th Edition - Clinical stage from 09/07/2023: Stage 0 (cTis (DCIS), cN0, cM0) - Signed by Babara Call, MD on 09/07/2023 Stage prefix: Initial diagnosis       Today patient presented.  Evaluation. Family history is positive for grandma and aunt with breast cancer.  Father had colon cancer.  MEDICAL HISTORY:  Past Medical History:  Diagnosis Date   Carpal tunnel syndrome, bilateral 04/19/2014   Chronic kidney disease    Dyshidrotic hand dermatitis 04/19/2014   Excess ear wax 11/15/2012   History of kidney stones    Hyperlipidemia    Hypertension    Paresthesia 04/19/2014   Pre-diabetes    Prediabetes 10/30/2012   Renal mass    Vitamin B 12 deficiency 04/22/2014    SURGICAL HISTORY: Past Surgical History:  Procedure Laterality Date   APPENDECTOMY  11/23/2016   Dr. Nicholaus   BREAST BIOPSY Right 08/25/2023   US  RT BREAST BX W LOC DEV 1ST LESION IMG BX SPEC US  GUIDE 08/25/2023 [facility]   BREAST BIOPSY Right 08/25/2023   MM RT BREAST BX W LOC DEV 1ST LESION IMAGE BX SPEC STEREO GUIDE 08/25/2023 [facility]   COLONOSCOPY WITH PROPOFOL  N/A 03/09/2021   Procedure: COLONOSCOPY WITH PROPOFOL ;  Surgeon: Onita Elspeth Sharper, DO;  Location: [facility] ENDOSCOPY;  Service: Gastroenterology;  Laterality: N/A;   KIDNEY STONE SURGERY     LAPAROSCOPIC APPENDECTOMY N/A 11/23/2016   Procedure: APPENDECTOMY LAPAROSCOPIC;  Surgeon: Nicholaus Selinda Birmingham, MD;  Location: [facility] ORS;  Service: General;  Laterality: N/A;    SOCIAL HISTORY: Social History   Socioeconomic History   Marital status: Married    Spouse name: Not on file   Number of children: Not on file   Years of education: Not on file   Highest education level: Not on file  Occupational History   Not on file  Tobacco Use   Smoking status: Former    Current packs/day: 0.00    Types: Cigarettes    Quit date: 06/05/2008    Years since quitting: 15.2   Smokeless tobacco: Never  Vaping Use   Vaping status: Never Used  Substance and Sexual Activity   Alcohol use: Not Currently    Comment: Social   Drug use: No   Sexual activity: Yes    Birth control/protection: None, Post-menopausal   Other Topics Concern   Not on file  Social History Narrative   Not on file   Social Drivers of Health   Financial Resource Strain: Low Risk  (09/07/2023)   Overall Financial Resource Strain (CARDIA)    Difficulty of Paying Living Expenses: Not very hard  Food Insecurity: No Food Insecurity (09/07/2023)   Hunger Vital Sign    Worried About Running Out of Food in the Last Year: Never true    Ran Out of Food in the Last Year: Never true  Transportation Needs: No Transportation Needs (09/07/2023)   PRAPARE - Administrator, Civil Service (Medical): No  Lack of Transportation (Non-Medical): No  Physical Activity: Not on file  Stress: No Stress Concern Present (09/07/2023)   [employer] of Occupational Health - Occupational Stress Questionnaire    Feeling of Stress: Only a little  Social Connections: Not on file  Intimate Partner Violence: Not At Risk (09/07/2023)   Humiliation, Afraid, Rape, and Kick questionnaire    Fear of Current or Ex-Partner: No    Emotionally Abused: No    Physically Abused: No    Sexually Abused: No    FAMILY HISTORY: Family History  Problem Relation Age of Onset   Hypertension Mother    Lung cancer Mother    COPD Mother    Colon cancer Father    Breast cancer Maternal Grandmother 58   Emphysema Maternal Grandmother    Melanoma Maternal Grandmother     ALLERGIES:  is allergic to penicillins and sulfa antibiotics.  MEDICATIONS:  Current Outpatient Medications  Medication Sig Dispense Refill   hydrALAZINE (APRESOLINE) 50 MG tablet Take 50 mg by mouth 2 (two) times daily.     lisinopril (ZESTRIL) 40 MG tablet Take 40 mg by mouth.     methenamine (HIPREX) 1 g tablet Take 1 g by mouth 2 (two) times daily with a meal.     OZEMPIC, 0.25 OR 0.5 MG/DOSE, 2 MG/3ML SOPN Inject 0.25 mg into the skin.     PARoxetine (PAXIL) 10 MG tablet Take 10 mg daily by mouth.     simvastatin (ZOCOR) 40 MG tablet Take 40 mg by mouth at bedtime.      benazepril (LOTENSIN) 20 MG tablet Take 20 mg daily by mouth. (Patient not taking: Reported on 09/07/2023)     Cholecalciferol (VITAMIN D3) 10 MCG (400 UNIT) CAPS Take by mouth. (Patient not taking: Reported on 09/07/2023)     methenamine (HIPREX) 1 g tablet Take 1 g by mouth. (Patient not taking: Reported on 09/07/2023)     oxybutynin (DITROPAN-XL) 5 MG 24 hr tablet Take 5 mg by mouth at bedtime. (Patient not taking: Reported on 09/07/2023)     simvastatin (ZOCOR) 20 MG tablet Take 20 mg by mouth at bedtime. (Patient not taking: Reported on 09/07/2023)     vitamin B-12 (CYANOCOBALAMIN) 1000 MCG tablet Take 1,000 mcg by mouth daily. (Patient not taking: Reported on 09/07/2023)     No current facility-administered medications for this visit.    Review of Systems  Constitutional:  Negative for appetite change, chills, fatigue and fever.  HENT:   Negative for hearing loss and voice change.   Eyes:  Negative for eye problems.  Respiratory:  Negative for chest tightness and cough.   Cardiovascular:  Negative for chest pain.  Gastrointestinal:  Negative for abdominal distention, abdominal pain and blood in stool.  Endocrine: Negative for hot flashes.  Genitourinary:  Negative for difficulty urinating and frequency.   Musculoskeletal:  Negative for arthralgias.  Skin:  Negative for itching and rash.  Neurological:  Negative for extremity weakness.  Hematological:  Negative for adenopathy.  Psychiatric/Behavioral:  Negative for confusion.    PHYSICAL EXAMINATION:  Vitals:   09/07/23 0930  BP: 136/65  Pulse: 98  Resp: 18  Temp: 97.8 F (36.6 C)  SpO2: 100%   Filed Weights   09/07/23 0930  Weight: 238 lb 1.6 oz (108 kg)    Physical Exam Constitutional:      General: She is not in acute distress. HENT:     Head: Normocephalic and atraumatic.  Eyes:     General:  No scleral icterus. Cardiovascular:     Rate and Rhythm: Normal rate and regular rhythm.     Heart sounds: Normal heart  sounds.  Pulmonary:     Effort: Pulmonary effort is normal. No respiratory distress.     Breath sounds: No wheezing.  Abdominal:     General: Bowel sounds are normal. There is no distension.     Palpations: Abdomen is soft.  Musculoskeletal:        General: No deformity. Normal range of motion.     Cervical back: Normal range of motion and neck supple.  Skin:    General: Skin is warm and dry.     Findings: No erythema or rash.  Neurological:     Mental Status: She is alert and oriented to person, place, and time. Mental status is at baseline.     Cranial Nerves: No cranial nerve deficit.     Coordination: Coordination normal.  Psychiatric:        Mood and Affect: Mood normal.     LABORATORY DATA:  I have reviewed the data as listed    Latest Ref Rng & Units 09/07/2023   10:26 AM 11/24/2016    4:22 AM 11/23/2016    1:07 PM  CBC  WBC 4.0 - 10.5 K/uL 8.6  13.1  12.3   Hemoglobin 12.0 - 15.0 g/dL 85.4  87.1  86.0   Hematocrit 36.0 - 46.0 % 43.6  38.1  41.3   Platelets 150 - 400 K/uL 277  195  240       Latest Ref Rng & Units 09/07/2023   10:26 AM 11/23/2016    1:07 PM  CMP  Glucose 70 - 99 mg/dL 882  882   BUN 6 - 20 mg/dL 15  19   Creatinine 9.55 - 1.00 mg/dL 9.32  9.28   Sodium 864 - 145 mmol/L 135  137   Potassium 3.5 - 5.1 mmol/L 4.0  3.7   Chloride 98 - 111 mmol/L 101  101   CO2 22 - 32 mmol/L 23  24   Calcium 8.9 - 10.3 mg/dL 9.7  9.3   Total Protein 6.5 - 8.1 g/dL 8.0  8.0   Total Bilirubin 0.0 - 1.2 mg/dL 0.9  1.1   Alkaline Phos 38 - 126 U/L 70  98   AST 15 - 41 U/L 31  21   ALT 0 - 44 U/L 35  25       RADIOGRAPHIC STUDIES: I have personally reviewed the radiological images as listed and agreed with the findings in the report. US  RT BREAST BX W LOC DEV 1ST LESION IMG BX SPEC US  GUIDE Addendum Date: 08/26/2023 ADDENDUM REPORT: 08/26/2023 15:06 ADDENDUM: PATHOLOGY revealed: Site 1. Breast, right, needle core biopsy, 6 o'clock retroareolar, venus clip : -  DUCTAL CARCINOMA IN SITU, NUCLEAR GRADE 2 OF 3 WITH CALCIFICATIONS - NECROSIS: SINGLE CELL NECROSIS - CALCIFICATIONS: PRESENT - DCIS LENGTH: 3 MM IN GREATEST LINEAR DIMENSION. Pathology results are CONCORDANT with imaging findings, per Norleen Croak M.D. PATHOLOGY revealed: Site 2. 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 Pathology results and recommendations below were discussed with patient by telephone on 08/26/2023 by Rock Hover RN. Patient reported biopsy site  within normal limits with slight tenderness at the site. Post biopsy care instructions were reviewed, questions were answered and my direct phone number was provided to patient. Patient was instructed to call [facility] if any concerns or questions arise related to the biopsy. RECOMMENDATIONS: 1. Surgical and oncological consultation. Request for surgical and oncological consultation relayed to Shasta Ada RN at [facility] by Rock Hover RN on 08/26/2023. Given extent of calcifications, if breast conservation is pursued a bracketed localization will be necessary. Pathology results reported by Rock Hover RN on 08/26/2023. Electronically Signed   By: Norleen Croak M.D.   On: 08/26/2023 15:06   Result Date: 08/26/2023 CLINICAL DATA:  RIGHT breast inferior calcifications were ultrasound and stereotactic biopsy EXAM: ULTRASOUND GUIDED RIGHT BREAST CORE NEEDLE BIOPSY and STEREOTACTIC GUIDED RIGHT BREAST CORE NEEDLE BIOPSY COMPARISON:  Previous exam(s). PROCEDURE: Site 1: RIGHT breast 6 o'clock RETROAREOLAR calcifications, ultrasound-guided, anterior depth, Venus clip I met with the patient and we discussed the procedure of ultrasound-guided  biopsy, including benefits and alternatives. We discussed the high likelihood of a successful procedure. We discussed the risks of the procedure, including infection, bleeding, tissue injury, clip migration, and inadequate sampling. Informed written consent was given. The usual time-out protocol was performed immediately prior to the procedure. Lesion quadrant: LOWER OUTER Using sterile technique and 1% lidocaine  and 1% lidocaine  with epinephrine  as local anesthetic, under direct ultrasound visualization, a 14 gauge spring-loaded device was used to perform biopsy of the RIGHT breast 6 o'clock calcifications RETROAREOLAR position using a inferior approach. At the conclusion of the procedure Venus shaped tissue marker clip was deployed into the biopsy cavity. Site 2: RIGHT breast inferior calcifications, stereotactic guided, middle depth, X clip The patient and I discussed the procedure of stereotactic-guided biopsy including benefits and alternatives. We discussed the high likelihood of a successful procedure. We discussed the risks of the procedure including infection, bleeding, tissue injury, clip migration, and inadequate sampling. Informed written consent was given. The usual time out protocol was performed immediately prior to the procedure. Using sterile technique and 1% lidocaine  and 1% lidocaine  with epinephrine  as local anesthetic, under stereotactic guidance, a 9 gauge vacuum assisted device was used to perform core needle biopsy of calcifications in the lower outer quadrant of the RIGHT breast using a LATERAL approach. Specimen radiograph was performed showing representative calcifications. Specimens with calcifications are identified for pathology. Lesion quadrant: Lower outer At the conclusion of the procedure, X shaped tissue marker clip was deployed into the biopsy cavity. Follow-up 2-view mammogram was performed and dictated separately. IMPRESSION: Ultrasound guided biopsy and stereotactic guided  biopsy of calcifications in the inferior RIGHT breast. No apparent complications. Electronically Signed: By: Norleen Croak M.D. On: 08/25/2023 09:00   MM RT BREAST BX W LOC DEV 1ST LESION IMAGE BX SPEC STEREO GUIDE Addendum Date: 08/26/2023 ADDENDUM REPORT: 08/26/2023 15:06 ADDENDUM: PATHOLOGY revealed: Site 1. 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 Pathology results are CONCORDANT with imaging findings, per Norleen Croak M.D. PATHOLOGY revealed: Site 2. Breast, right, needle core biopsy, inferior middle depth, x clip : - DUCTAL CARCINOMA IN SITU, NUCLEAR GRADE 2 OF 3 WITH CALCIFICATIONS - NECROSIS: SINGLE CELL NECROSIS - CALCIFICATIONS: PRESENT - DCIS LENGTH: MULTIFOCAL FOCI EACH APPROXIMATELY 2 MM IN GREATEST LINEAR Pathology results are CONCORDANT  with imaging findings, per Norleen Croak M.D. Pathology results and recommendations below were discussed with patient by telephone on 08/26/2023 by Rock Hover RN. Patient reported biopsy site within normal limits with slight tenderness at the site. Post biopsy care instructions were reviewed, questions were answered and my direct phone number was provided to patient. Patient was instructed to call [facility] if any concerns or questions arise related to the biopsy. RECOMMENDATIONS: 1. Surgical and oncological consultation. Request for surgical and oncological consultation relayed to Shasta Ada RN at [facility] by Rock Hover RN on 08/26/2023. Given extent of calcifications, if breast conservation is pursued a bracketed localization will be necessary. Pathology results reported by Rock Hover RN on 08/26/2023. Electronically Signed   By: Norleen Croak M.D.   On: 08/26/2023 15:06   Result Date: 08/26/2023 CLINICAL DATA:  RIGHT breast inferior  calcifications were ultrasound and stereotactic biopsy EXAM: ULTRASOUND GUIDED RIGHT BREAST CORE NEEDLE BIOPSY and STEREOTACTIC GUIDED RIGHT BREAST CORE NEEDLE BIOPSY COMPARISON:  Previous exam(s). PROCEDURE: Site 1: RIGHT breast 6 o'clock RETROAREOLAR calcifications, ultrasound-guided, anterior depth, Venus clip I met with the patient and we discussed the procedure of ultrasound-guided biopsy, including benefits and alternatives. We discussed the high likelihood of a successful procedure. We discussed the risks of the procedure, including infection, bleeding, tissue injury, clip migration, and inadequate sampling. Informed written consent was given. The usual time-out protocol was performed immediately prior to the procedure. Lesion quadrant: LOWER OUTER Using sterile technique and 1% lidocaine  and 1% lidocaine  with epinephrine  as local anesthetic, under direct ultrasound visualization, a 14 gauge spring-loaded device was used to perform biopsy of the RIGHT breast 6 o'clock calcifications RETROAREOLAR position using a inferior approach. At the conclusion of the procedure Venus shaped tissue marker clip was deployed into the biopsy cavity. Site 2: RIGHT breast inferior calcifications, stereotactic guided, middle depth, X clip The patient and I discussed the procedure of stereotactic-guided biopsy including benefits and alternatives. We discussed the high likelihood of a successful procedure. We discussed the risks of the procedure including infection, bleeding, tissue injury, clip migration, and inadequate sampling. Informed written consent was given. The usual time out protocol was performed immediately prior to the procedure. Using sterile technique and 1% lidocaine  and 1% lidocaine  with epinephrine  as local anesthetic, under stereotactic guidance, a 9 gauge vacuum assisted device was used to perform core needle biopsy of calcifications in the lower outer quadrant of the RIGHT breast using a LATERAL approach.  Specimen radiograph was performed showing representative calcifications. Specimens with calcifications are identified for pathology. Lesion quadrant: Lower outer At the conclusion of the procedure, X shaped tissue marker clip was deployed into the biopsy cavity. Follow-up 2-view mammogram was performed and dictated separately. IMPRESSION: Ultrasound guided biopsy and stereotactic guided biopsy of calcifications in the inferior RIGHT breast. No apparent complications. Electronically Signed: By: Norleen Croak M.D. On: 08/25/2023 09:00   MM CLIP PLACEMENT RIGHT Result Date: 08/25/2023 CLINICAL DATA:  Status post same day ultrasound and stereotactic guided biopsies of RIGHT breast calcifications EXAM: 3D DIAGNOSTIC RIGHT MAMMOGRAM POST ULTRASOUND and STEREOTACTIC BIOPSIES COMPARISON:  Previous exam(s). ACR Breast Density Category b: There are scattered areas of fibroglandular density. FINDINGS: 3D Mammographic images were obtained following ultrasound and stereotactic guided biopsies of calcifications in the inferior RIGHT breast. The Venus clip is in expected location at the site of ultrasound-guided, anterior depth biopsy. The X shaped clip is in appropriate position at the posterior extent of the calcifications at the  site of middle depth biopsy. IMPRESSION: Appropriate positioning of biopsy marking clips at the sites of biopsy in the inferior RIGHT breast, as above. Final Assessment: Post Procedure Mammograms for Marker Placement Electronically Signed   By: Norleen Croak M.D.   On: 08/25/2023 09:04   MM 3D DIAGNOSTIC MAMMOGRAM BILATERAL BREAST Result Date: 08/12/2023 CLINICAL DATA:  LEFT breast palpable EXAM: DIGITAL DIAGNOSTIC BILATERAL MAMMOGRAM WITH TOMOSYNTHESIS AND CAD; ULTRASOUND RIGHT BREAST LIMITED; ULTRASOUND LEFT BREAST LIMITED TECHNIQUE: Bilateral digital diagnostic mammography and breast tomosynthesis was performed. The images were evaluated with computer-aided detection. ; Targeted ultrasound  examination of the right breast was performed; Targeted ultrasound examination of the left breast was performed. COMPARISON:  Previous exam(s). ACR Breast Density Category b: There are scattered areas of fibroglandular density. FINDINGS: Spot compression tomosynthesis views were obtained over the palpable area of concern in the LEFT breast. No suspicious mammographic finding is identified in this area. There is focal skin thickening noted in this area. No suspicious mass, microcalcification, or other finding is identified in the LEFT breast. Spot magnification views of the RIGHT breast demonstrate a dominant group of punctate calcifications in a linear distribution in the RIGHT lower breast at anterior depth which spans 17 mm. Additional loosely associated scattered and loosely grouped punctate calcifications in the lower outer breast at middle depth span approximately 5 cm inclusive of the dominant group. On physical exam, there is a superficial dermal papule at the site of palpable concern in the LEFT upper outer breast. No suspicious mass is appreciated in the RIGHT retroareolar breast. Targeted LEFT breast ultrasound was performed in the palpable area of concern at the upper outer breast. There is an intradermal mass noted the site of palpable concern which is predominantly cystic in appearance. It measures 6 x 3 by 6 mm. A tract is noted to the skin. This corresponds to the area of focal skin thickening noted mammographically. Targeted ultrasound was performed of the RIGHT lower breast. At 6 o'clock in the retroareolar breast, there is a duct with internal echogenic foci most consistent with the calcifications at anterior depth noted mammographically. This area spans approximately 6 mm. IMPRESSION: 1. There is a benign appearing intradermal mass at the site of palpable concern in the LEFT breast most consistent with an epidermal inclusion cyst. Recommend clinical management of this mass. 2. There is  approximately 5 cm of indeterminate calcifications in the RIGHT breast with a dominant 17 mm group in the RIGHT lower breast at anterior depth. There is a favored sonographic correlate for these anterior calcifications at 6 o'clock in the retroareolar breast. Recommend ultrasound-guided biopsy of these calcifications at 6 o'clock followed by stereotactic guided biopsy of a second representative area of calcifications at middle depth for definitive characterization. 3. No mammographic evidence of malignancy in the LEFT breast. RECOMMENDATION: 1. RIGHT breast ultrasound-guided biopsy x1 2. RIGHT breast stereotactic guided biopsy x1 (second stereotactic guided biopsy if deemed necessary due to ultrasound/mammogram discordance). I have discussed the findings and recommendations with the patient. The biopsy procedure was discussed with the patient and questions were answered. Patient expressed their understanding of the biopsy recommendation. Patient will be scheduled for biopsy at her earliest convenience by the schedulers. Ordering provider will be notified. If applicable, a reminder letter will be sent to the patient regarding the next appointment. BI-RADS CATEGORY  4: Suspicious. Electronically Signed   By: Corean Salter M.D.   On: 08/12/2023 14:51   [place]  LIMITED ULTRASOUND INCLUDING AXILLA LEFT BREAST  Result  Date: 08/12/2023 CLINICAL DATA:  LEFT breast palpable EXAM: DIGITAL DIAGNOSTIC BILATERAL MAMMOGRAM WITH TOMOSYNTHESIS AND CAD; ULTRASOUND RIGHT BREAST LIMITED; ULTRASOUND LEFT BREAST LIMITED TECHNIQUE: Bilateral digital diagnostic mammography and breast tomosynthesis was performed. The images were evaluated with computer-aided detection. ; Targeted ultrasound examination of the right breast was performed; Targeted ultrasound examination of the left breast was performed. COMPARISON:  Previous exam(s). ACR Breast Density Category b: There are scattered areas of fibroglandular density. FINDINGS: Spot  compression tomosynthesis views were obtained over the palpable area of concern in the LEFT breast. No suspicious mammographic finding is identified in this area. There is focal skin thickening noted in this area. No suspicious mass, microcalcification, or other finding is identified in the LEFT breast. Spot magnification views of the RIGHT breast demonstrate a dominant group of punctate calcifications in a linear distribution in the RIGHT lower breast at anterior depth which spans 17 mm. Additional loosely associated scattered and loosely grouped punctate calcifications in the lower outer breast at middle depth span approximately 5 cm inclusive of the dominant group. On physical exam, there is a superficial dermal papule at the site of palpable concern in the LEFT upper outer breast. No suspicious mass is appreciated in the RIGHT retroareolar breast. Targeted LEFT breast ultrasound was performed in the palpable area of concern at the upper outer breast. There is an intradermal mass noted the site of palpable concern which is predominantly cystic in appearance. It measures 6 x 3 by 6 mm. A tract is noted to the skin. This corresponds to the area of focal skin thickening noted mammographically. Targeted ultrasound was performed of the RIGHT lower breast. At 6 o'clock in the retroareolar breast, there is a duct with internal echogenic foci most consistent with the calcifications at anterior depth noted mammographically. This area spans approximately 6 mm. IMPRESSION: 1. There is a benign appearing intradermal mass at the site of palpable concern in the LEFT breast most consistent with an epidermal inclusion cyst. Recommend clinical management of this mass. 2. There is approximately 5 cm of indeterminate calcifications in the RIGHT breast with a dominant 17 mm group in the RIGHT lower breast at anterior depth. There is a favored sonographic correlate for these anterior calcifications at 6 o'clock in the retroareolar  breast. Recommend ultrasound-guided biopsy of these calcifications at 6 o'clock followed by stereotactic guided biopsy of a second representative area of calcifications at middle depth for definitive characterization. 3. No mammographic evidence of malignancy in the LEFT breast. RECOMMENDATION: 1. RIGHT breast ultrasound-guided biopsy x1 2. RIGHT breast stereotactic guided biopsy x1 (second stereotactic guided biopsy if deemed necessary due to ultrasound/mammogram discordance). I have discussed the findings and recommendations with the patient. The biopsy procedure was discussed with the patient and questions were answered. Patient expressed their understanding of the biopsy recommendation. Patient will be scheduled for biopsy at her earliest convenience by the schedulers. Ordering provider will be notified. If applicable, a reminder letter will be sent to the patient regarding the next appointment. BI-RADS CATEGORY  4: Suspicious. Electronically Signed   By: Corean Salter M.D.   On: 08/12/2023 14:51   US  LIMITED ULTRASOUND INCLUDING AXILLA RIGHT BREAST Result Date: 08/12/2023 CLINICAL DATA:  LEFT breast palpable EXAM: DIGITAL DIAGNOSTIC BILATERAL MAMMOGRAM WITH TOMOSYNTHESIS AND CAD; ULTRASOUND RIGHT BREAST LIMITED; ULTRASOUND LEFT BREAST LIMITED TECHNIQUE: Bilateral digital diagnostic mammography and breast tomosynthesis was performed. The images were evaluated with computer-aided detection. ; Targeted ultrasound examination of the right breast was performed; Targeted ultrasound examination of  the left breast was performed. COMPARISON:  Previous exam(s). ACR Breast Density Category b: There are scattered areas of fibroglandular density. FINDINGS: Spot compression tomosynthesis views were obtained over the palpable area of concern in the LEFT breast. No suspicious mammographic finding is identified in this area. There is focal skin thickening noted in this area. No suspicious mass, microcalcification, or  other finding is identified in the LEFT breast. Spot magnification views of the RIGHT breast demonstrate a dominant group of punctate calcifications in a linear distribution in the RIGHT lower breast at anterior depth which spans 17 mm. Additional loosely associated scattered and loosely grouped punctate calcifications in the lower outer breast at middle depth span approximately 5 cm inclusive of the dominant group. On physical exam, there is a superficial dermal papule at the site of palpable concern in the LEFT upper outer breast. No suspicious mass is appreciated in the RIGHT retroareolar breast. Targeted LEFT breast ultrasound was performed in the palpable area of concern at the upper outer breast. There is an intradermal mass noted the site of palpable concern which is predominantly cystic in appearance. It measures 6 x 3 by 6 mm. A tract is noted to the skin. This corresponds to the area of focal skin thickening noted mammographically. Targeted ultrasound was performed of the RIGHT lower breast. At 6 o'clock in the retroareolar breast, there is a duct with internal echogenic foci most consistent with the calcifications at anterior depth noted mammographically. This area spans approximately 6 mm. IMPRESSION: 1. There is a benign appearing intradermal mass at the site of palpable concern in the LEFT breast most consistent with an epidermal inclusion cyst. Recommend clinical management of this mass. 2. There is approximately 5 cm of indeterminate calcifications in the RIGHT breast with a dominant 17 mm group in the RIGHT lower breast at anterior depth. There is a favored sonographic correlate for these anterior calcifications at 6 o'clock in the retroareolar breast. Recommend ultrasound-guided biopsy of these calcifications at 6 o'clock followed by stereotactic guided biopsy of a second representative area of calcifications at middle depth for definitive characterization. 3. No mammographic evidence of malignancy  in the LEFT breast. RECOMMENDATION: 1. RIGHT breast ultrasound-guided biopsy x1 2. RIGHT breast stereotactic guided biopsy x1 (second stereotactic guided biopsy if deemed necessary due to ultrasound/mammogram discordance). I have discussed the findings and recommendations with the patient. The biopsy procedure was discussed with the patient and questions were answered. Patient expressed their understanding of the biopsy recommendation. Patient will be scheduled for biopsy at her earliest convenience by the schedulers. Ordering provider will be notified. If applicable, a reminder letter will be sent to the patient regarding the next appointment. BI-RADS CATEGORY  4: Suspicious. Electronically Signed   By: Corean Salter M.D.   On: 08/12/2023 14:51

## 2023-09-07 NOTE — Assessment & Plan Note (Addendum)
 Right breast multifocal DCIS, grade 2 ER positive The DCIS diagnosis and care plan were discussed with patient in detail.  We discussed that DCIS is a non invasive breast cancer,  cancer is only in the duct and has not spread into the tissues around it. Will check with radiology to see if right axillary lymph node was imaged. Recommend lumpectomy followed by adjuvant radiation and endocrine therapy.

## 2023-09-07 NOTE — Assessment & Plan Note (Signed)
Recommend genetic counseling.  Patient declined.

## 2023-09-12 ENCOUNTER — Other Ambulatory Visit: Payer: Self-pay | Admitting: Oncology

## 2023-09-12 DIAGNOSIS — D0511 Intraductal carcinoma in situ of right breast: Secondary | ICD-10-CM

## 2023-09-14 DIAGNOSIS — D493 Neoplasm of unspecified behavior of breast: Secondary | ICD-10-CM | POA: Insufficient documentation

## 2023-09-14 NOTE — H&P (View-Only) (Signed)
 Patient ID: Sherri Forbes, female   DOB: [birth year], 53 y.o.   MRN: [id number]  Chief Complaint: DCIS right breast  History of Present Illness Sherri Forbes is a 53 y.o. female with a dermal lesion of her left breast that prompted a diagnostic exam when she was due for her annual screening mammography regardless.  Screening identified 2 areas of concern, both with pathology as noted below.  She had ultrasound and stereotactic guided core biopsies obtained. She has DCIS of the right breast, multifocal, ER/PR positive.  Initial ultrasound did not evaluate the right axilla. She has a history of birth control use, she is postmenopausal.  Family history of breast cancer in a grandmother and an aunt.  She administrating at age of 45 she is gravida 1 para 1.  She was 28 at the age of her first pregnancy.  She is otherwise asymptomatic from breast issues noticed charge of the nipple, no breast pain, no palpable masses.  Past Medical History Past Medical History:  Diagnosis Date   Carpal tunnel syndrome, bilateral 04/19/2014   Chronic kidney disease    Dyshidrotic hand dermatitis 04/19/2014   Excess ear wax 11/15/2012   History of kidney stones    Hyperlipidemia    Hypertension    Paresthesia 04/19/2014   Pre-diabetes    Prediabetes 10/30/2012   Renal mass    Vitamin B 12 deficiency 04/22/2014      Past Surgical History:  Procedure Laterality Date   APPENDECTOMY  11/23/2016   Dr. Nicholaus   BREAST BIOPSY Right 08/25/2023   US  RT BREAST BX W LOC DEV 1ST LESION IMG BX SPEC US  GUIDE 08/25/2023 [facility]   BREAST BIOPSY Right 08/25/2023   MM RT BREAST BX W LOC DEV 1ST LESION IMAGE BX SPEC STEREO GUIDE 08/25/2023 [facility]   COLONOSCOPY WITH PROPOFOL  N/A 03/09/2021   Procedure: COLONOSCOPY WITH PROPOFOL ;  Surgeon: Onita Elspeth Sharper, DO;  Location: [facility];  Service: Gastroenterology;  Laterality: N/A;   KIDNEY STONE SURGERY     LAPAROSCOPIC APPENDECTOMY N/A 11/23/2016    Procedure: APPENDECTOMY LAPAROSCOPIC;  Surgeon: Nicholaus Selinda Birmingham, MD;  Location: [facility];  Service: General;  Laterality: N/A;    Allergies  Allergen Reactions   Penicillins Other (See Comments)   Sulfa Antibiotics Hives    Current Outpatient Medications  Medication Sig Dispense Refill   hydrALAZINE (APRESOLINE) 50 MG tablet Take 50 mg by mouth 2 (two) times daily.     lisinopril (ZESTRIL) 40 MG tablet Take 40 mg by mouth.     methenamine (HIPREX) 1 g tablet Take 1 g by mouth 2 (two) times daily with a meal.     OZEMPIC, 0.25 OR 0.5 MG/DOSE, 2 MG/3ML SOPN Inject 0.25 mg into the skin.     PARoxetine (PAXIL) 10 MG tablet Take 10 mg daily by mouth.     simvastatin (ZOCOR) 40 MG tablet Take 40 mg by mouth at bedtime.     No current facility-administered medications for this visit.    Family History Family History  Problem Relation Age of Onset   Hypertension Mother    Lung cancer Mother    COPD Mother    Colon cancer Father    Breast cancer Maternal Grandmother 62   Emphysema Maternal Grandmother    Melanoma Maternal Grandmother       Social History Social History   Tobacco Use   Smoking status: Former    Current packs/day: 0.00    Types: Cigarettes    Quit  date: 06/05/2008    Years since quitting: 15.2   Smokeless tobacco: Never  Vaping Use   Vaping status: Never Used  Substance Use Topics   Alcohol use: Not Currently    Comment: Social   Drug use: No        Review of Systems  Constitutional: Negative.   HENT: Negative.    Eyes: Negative.   Respiratory: Negative.    Cardiovascular: Negative.   Gastrointestinal: Negative.   Genitourinary: Negative.   Skin: Negative.   Neurological: Negative.   Psychiatric/Behavioral: Negative.       Physical Exam Blood pressure (!) 168/87, pulse (!) 120, temperature 99 F (37.2 C), temperature source Oral, height 5' 4 (1.626 m), weight 238 lb 12.8 oz (108.3 kg), last menstrual period 07/07/2016, SpO2 96%. Last  Weight  Most recent update: 09/15/2023  9:20 AM    Weight  108.3 kg (238 lb 12.8 oz)             CONSTITUTIONAL: Well developed, and nourished, appropriately responsive and aware without distress.   EYES: Sclera non-icteric.   EARS, NOSE, MOUTH AND THROAT:  The oropharynx is clear. Oral mucosa is pink and moist.    Hearing is intact to voice.  NECK: Trachea is midline, and there is no jugular venous distension.  LYMPH NODES:  Lymph nodes in the neck are not appreciated. RESPIRATORY:  Lungs are clear, and breath sounds are equal bilaterally.  Normal respiratory effort without pathologic use of accessory muscles. CARDIOVASCULAR: Heart is regular in rate and rhythm.   Well perfused.  GI: The abdomen is  soft, nontender, and nondistended. There were no palpable masses. MUSCULOSKELETAL:  Symmetrical muscle tone appreciated in all four extremities.    SKIN: Skin turgor is normal. No pathologic skin lesions appreciated.  NEUROLOGIC:  Motor and sensation appear grossly normal.  Cranial nerves are grossly without defect. PSYCH:  Alert and oriented to person, place and time. Affect is appropriate for situation.  Data Reviewed I have personally reviewed what is currently available of the patient's imaging, recent labs and medical records.   Labs:     Latest Ref Rng & Units 09/07/2023   10:26 AM 11/24/2016    4:22 AM 11/23/2016    1:07 PM  CBC  WBC 4.0 - 10.5 K/uL 8.6  13.1  12.3   Hemoglobin 12.0 - 15.0 g/dL 85.4  87.1  86.0   Hematocrit 36.0 - 46.0 % 43.6  38.1  41.3   Platelets 150 - 400 K/uL 277  195  240       Latest Ref Rng & Units 09/07/2023   10:26 AM 11/23/2016    1:07 PM  CMP  Glucose 70 - 99 mg/dL 882  882   BUN 6 - 20 mg/dL 15  19   Creatinine 9.55 - 1.00 mg/dL 9.32  9.28   Sodium 864 - 145 mmol/L 135  137   Potassium 3.5 - 5.1 mmol/L 4.0  3.7   Chloride 98 - 111 mmol/L 101  101   CO2 22 - 32 mmol/L 23  24   Calcium 8.9 - 10.3 mg/dL 9.7  9.3   Total Protein 6.5 - 8.1 g/dL  8.0  8.0   Total Bilirubin 0.0 - 1.2 mg/dL 0.9  1.1   Alkaline Phos 38 - 126 U/L 70  98   AST 15 - 41 U/L 31  21   ALT 0 - 44 U/L 35  25     REPORT OF SURGICAL PATHOLOGY  FINAL DIAGNOSIS       1. 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 PATRICK REVIEWED THE CASE AND CONCURS WITH THE INTERPRETATION.  A BREAST      PROGNOSTIC PROFILE (ER AND PR) IS PENDING AND WILL BE REPORTED IN AN ADDENDUM.      LINDA NASH WAS NOTIFIED ON 08/26/2023.       2. 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      - NECROSIS: SINGLE CELL NECROSIS      - CALCIFICATIONS: PRESENT      - DCIS LENGTH: MULTIFOCAL FOCI EACH APPROXIMATELY 2 MM IN GREATEST LINEAR      DIMENSION PRESENT ON PARTS 2A, 2B, 2C AND 2D  PROGNOSTIC INDICATORS Results: IMMUNOHISTOCHEMICAL AND MORPHOMETRIC ANALYSIS PERFORMED MANUALLY Estrogen Receptor:  99%, POSITIVE, STRONG STAINING INTENSITY Progesterone Receptor:  40%, POSITIVE, MODERATE-STRONG STAINING INTENSITY REFERENCE RANGE ESTROGEN RECEPTOR NEGATIVE     0% POSITIVE       =>1% REFERENCE RANGE PROGESTERONE RECEPTOR NEGATIVE     0% POSITIVE        =>1% All controls stained appropriately Picklesimer Md, Fred , Sports administrator, International aid/development worker ( Signed 08 11 2025) Breast, right, needle core biopsy, inferior middle depth, x clip PROGNOSTIC INDICATORS  Results: IMMUNOHISTOCHEMICAL AND MORPHOMETRIC ANALYSIS PERFORMED MANUALLY Estrogen Receptor:  100%, POSITIVE, STRONG STAINING INTENSITY Progesterone Receptor:  40%, POSITIVE, MODERATE-STRONG STAINING INTENSITY REFERENCE RANGE ESTROGEN RECEPTOR NEGATIVE     0% POSITIVE       =>1% REFERENCE  RANGE PROGESTERONE RECEPTOR NEGATIVE     0% POSITIVE        =>1% All controls stained appropriately Picklesimer Md, Fred , Sports administrator, International aid/development worker ( Signed 08 11 2025)    Imaging: Radiological images personally reviewed:  CLINICAL DATA:  LEFT breast palpable   EXAM: DIGITAL DIAGNOSTIC BILATERAL MAMMOGRAM WITH TOMOSYNTHESIS AND CAD; ULTRASOUND RIGHT BREAST LIMITED; ULTRASOUND LEFT BREAST LIMITED   TECHNIQUE: Bilateral digital diagnostic mammography and breast tomosynthesis was performed. The images were evaluated with computer-aided detection. ; Targeted ultrasound examination of the right breast was performed; Targeted ultrasound examination of the left breast was performed.   COMPARISON:  Previous exam(s).   ACR Breast Density Category b: There are scattered areas of fibroglandular density.   FINDINGS: Spot compression tomosynthesis views were obtained over the palpable area of concern in the LEFT breast. No suspicious mammographic finding is identified in this area. There is focal skin thickening noted in this area. No suspicious mass, microcalcification, or other finding is identified in the LEFT breast.   Spot magnification views of the RIGHT breast demonstrate a dominant group of punctate calcifications in a linear distribution in the RIGHT lower breast at anterior depth which spans 17 mm. Additional loosely associated scattered and loosely grouped punctate calcifications in the lower outer breast at middle depth span approximately 5 cm inclusive of the dominant group.   On physical exam, there is a superficial dermal papule at the site of palpable concern in the LEFT upper outer breast. No  suspicious mass is appreciated in the RIGHT retroareolar breast.   Targeted LEFT breast ultrasound was performed in the palpable area of concern at the upper outer breast. There is an intradermal mass noted the site of palpable concern which is predominantly cystic  in appearance. It measures 6 x 3 by 6 mm. A tract is noted to the skin. This corresponds to the area of focal skin thickening noted mammographically.   Targeted ultrasound was performed of the RIGHT lower breast. At 6 o'clock in the retroareolar breast, there is a duct with internal echogenic foci most consistent with the calcifications at anterior depth noted mammographically. This area spans approximately 6 mm.   IMPRESSION: 1. There is a benign appearing intradermal mass at the site of palpable concern in the LEFT breast most consistent with an epidermal inclusion cyst. Recommend clinical management of this mass. 2. There is approximately 5 cm of indeterminate calcifications in the RIGHT breast with a dominant 17 mm group in the RIGHT lower breast at anterior depth. There is a favored sonographic correlate for these anterior calcifications at 6 o'clock in the retroareolar breast. Recommend ultrasound-guided biopsy of these calcifications at 6 o'clock followed by stereotactic guided biopsy of a second representative area of calcifications at middle depth for definitive characterization. 3. No mammographic evidence of malignancy in the LEFT breast.   RECOMMENDATION: 1. RIGHT breast ultrasound-guided biopsy x1 2. RIGHT breast stereotactic guided biopsy x1 (second stereotactic guided biopsy if deemed necessary due to ultrasound/mammogram discordance).   I have discussed the findings and recommendations with the patient. The biopsy procedure was discussed with the patient and questions were answered. Patient expressed their understanding of the biopsy recommendation. Patient will be scheduled for biopsy at her earliest convenience by the schedulers. Ordering provider will be notified. If applicable, a reminder letter will be sent to the patient regarding the next appointment.   BI-RADS CATEGORY  4: Suspicious.     Electronically Signed   By: Corean Salter M.D.   On:  08/12/2023 14:51  CLINICAL DATA:  RIGHT breast inferior calcifications were ultrasound and stereotactic biopsy   EXAM: ULTRASOUND GUIDED RIGHT BREAST CORE NEEDLE BIOPSY and STEREOTACTIC GUIDED RIGHT BREAST CORE NEEDLE BIOPSY   COMPARISON:  Previous exam(s).   PROCEDURE: Site 1: RIGHT breast 6 o'clock RETROAREOLAR calcifications, ultrasound-guided, anterior depth, Venus clip   I met with the patient and we discussed the procedure of ultrasound-guided biopsy, including benefits and alternatives. We discussed the high likelihood of a successful procedure. We discussed the risks of the procedure, including infection, bleeding, tissue injury, clip migration, and inadequate sampling. Informed written consent was given. The usual time-out protocol was performed immediately prior to the procedure.   Lesion quadrant: LOWER OUTER   Using sterile technique and 1% lidocaine  and 1% lidocaine  with epinephrine  as local anesthetic, under direct ultrasound visualization, a 14 gauge spring-loaded device was used to perform biopsy of the RIGHT breast 6 o'clock calcifications RETROAREOLAR position using a inferior approach. At the conclusion of the procedure Venus shaped tissue marker clip was deployed into the biopsy cavity.   Site 2: RIGHT breast inferior calcifications, stereotactic guided, middle depth, X clip   The patient and I discussed the procedure of stereotactic-guided biopsy including benefits and alternatives. We discussed the high likelihood of a successful procedure. We discussed the risks of the procedure including infection, bleeding, tissue injury, clip migration, and inadequate sampling. Informed written consent was given. The usual time out protocol was performed immediately prior to the procedure.  Using sterile technique and 1% lidocaine  and 1% lidocaine  with epinephrine  as local anesthetic, under stereotactic guidance, a 9 gauge vacuum assisted device was used to  perform core needle biopsy of calcifications in the lower outer quadrant of the RIGHT breast using a LATERAL approach. Specimen radiograph was performed showing representative calcifications. Specimens with calcifications are identified for pathology.   Lesion quadrant: Lower outer   At the conclusion of the procedure, X shaped tissue marker clip was deployed into the biopsy cavity.   Follow-up 2-view mammogram was performed and dictated separately.   IMPRESSION: Ultrasound guided biopsy and stereotactic guided biopsy of calcifications in the inferior RIGHT breast. No apparent complications.   Electronically Signed: By: Norleen Croak M.D. On: 08/25/2023 09:00  CLINICAL DATA:  Status post same day ultrasound and stereotactic guided biopsies of RIGHT breast calcifications   EXAM: 3D DIAGNOSTIC RIGHT MAMMOGRAM POST ULTRASOUND and STEREOTACTIC BIOPSIES   COMPARISON:  Previous exam(s).   ACR Breast Density Category b: There are scattered areas of fibroglandular density.   FINDINGS: 3D Mammographic images were obtained following ultrasound and stereotactic guided biopsies of calcifications in the inferior RIGHT breast. The Venus clip is in expected location at the site of ultrasound-guided, anterior depth biopsy. The X shaped clip is in appropriate position at the posterior extent of the calcifications at the site of middle depth biopsy.   IMPRESSION: Appropriate positioning of biopsy marking clips at the sites of biopsy in the inferior RIGHT breast, as above.   Final Assessment: Post Procedure Mammograms for Marker Placement     Electronically Signed   By: Norleen Croak M.D.   On: 08/25/2023 09:04  CLINICAL DATA:  Patient is status post 2 site biopsy which demonstrated ductal carcinoma in-situ. Evaluation of the RIGHT axilla per treatment team request.   EXAM: ULTRASOUND OF THE RIGHT AXILLA   COMPARISON:  Previous exams.   FINDINGS: Targeted ultrasound was  performed of the RIGHT axilla. No suspicious axillary lymph nodes are visualized. Lymph nodes demonstrate preserved echogenic hila and thin smooth cortices.   IMPRESSION: No suspicious RIGHT axillary adenopathy.   RECOMMENDATION: Recommend continued clinical and surgical management of biopsy proven RIGHT breast ductal carcinoma in-situ.   I have discussed the findings and recommendations with the patient. If applicable, a reminder letter will be sent to the patient regarding the next appointment.   BI-RADS CATEGORY  2: Benign.     Electronically Signed   By: Corean Salter M.D.   On: 09/15/2023 10:36 Within last 24 hrs: No results found.  Assessment     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   Ductal carcinoma in situ (DCIS) of right breast 09/07/2023   Family history of cancer 09/07/2023   Former smoker 11/26/2016   Nephrolithiasis 11/26/2016   Acute appendicitis with rupture 11/23/2016   Morbid obesity with BMI of 40.0-44.9, adult (HCC) 12/30/2015   Left ureteral stone 12/28/2015   Hypercholesterolemia 11/22/2014   Vitamin B 12 deficiency 04/22/2014   Carpal tunnel syndrome, bilateral 04/19/2014   Dyshidrotic hand dermatitis 04/19/2014   Paresthesia 04/19/2014   Dysuria 05/23/2013   Urinary symptom or sign 05/23/2013   Excess ear wax 11/15/2012   Hypertension 11/15/2012   Prediabetes 10/30/2012    Plan    I ensured that she was well aware of her surgical options, including those of mastectomy with immediate reconstruction. I believe due to the proximity of these lesions and their location, that breast conservation  is certainly a feasible option, and ensured that she understood the pros and cons of this choice. Though it may be prudent to proceed with sentinel lymph node, considering the possible upstaging to an invasive cancer.  I discussed the available options with the patient. The risk of recurrence is  similar between mastectomy and lumpectomy with radiation.  I also discussed that given the small size of the cancer would recommend localization lumpectomy with radiation to follow.   I did raise the concern of how superficial and proximate to the nipple or anterior lesion is, we will attempt nipple/areolar preservation however this may be a source of positive margins.  I also discussed that the need to do a sentinel lymph node biopsy to check the nodes, is debatable.   Explained to the patient that after her surgical treatment additional treatment will depend on her prognostic indicators and stage.    I discussed risks of bleeding, infection, damage to surrounding tissues, having positive margins, needing further resection, damage to nerves causing arm numbness or difficulty raising arm, causing lymphedema in the arm; as well as anesthesia risks of MI, stroke, prolonged ventilation, pulmonary embolism, thrombosis and even death.   Patient was given the opportunity to ask questions and have them answered.  They would like to proceed with right breast bracketed Scout tag localized lumpectomy and defer axillary sentinel lymph node biopsy.    I personally spent a total of 60 minutes in the care of the patient today including preparing to see the patient, getting/reviewing separately obtained history, performing a medically appropriate exam/evaluation, counseling and educating, placing orders, documenting clinical information in the EHR, independently interpreting results, and coordinating care.   These notes generated with voice recognition software. I apologize for typographical errors.  Honor Leghorn M.D., FACS 09/15/2023, 12:30 PM

## 2023-09-14 NOTE — Progress Notes (Unsigned)
 Patient ID: Sherri Forbes, female   DOB: 02-13-1970, 53 y.o.   MRN: [id number]  Chief Complaint: DCIS right breast  History of Present Illness Sherri Forbes is a 53 y.o. female with a dermal lesion of her left breast that prompted a diagnostic exam when she was due for her annual screening mammography regardless.  Screening identified 2 areas of concern, both with pathology as noted below.  She had ultrasound and stereotactic guided core biopsies obtained. She has DCIS of the right breast, multifocal, ER/PR positive.  Initial ultrasound did not evaluate the right axilla. She has a history of birth control use, she is postmenopausal.  Family history of breast cancer in a grandmother and an aunt.  She administrating at age of 51 she is gravida 1 para 1.  She was 28 at the age of her first pregnancy.  She is otherwise asymptomatic from breast issues noticed charge of the nipple, no breast pain, no palpable masses.  Past Medical History Past Medical History:  Diagnosis Date   Carpal tunnel syndrome, bilateral 04/19/2014   Chronic kidney disease    Dyshidrotic hand dermatitis 04/19/2014   Excess ear wax 11/15/2012   History of kidney stones    Hyperlipidemia    Hypertension    Paresthesia 04/19/2014   Pre-diabetes    Prediabetes 10/30/2012   Renal mass    Vitamin B 12 deficiency 04/22/2014      Past Surgical History:  Procedure Laterality Date   APPENDECTOMY  11/23/2016   Dr. Nicholaus   BREAST BIOPSY Right 08/25/2023   US  RT BREAST BX W LOC DEV 1ST LESION IMG BX SPEC US  GUIDE 08/25/2023 [facility]   BREAST BIOPSY Right 08/25/2023   MM RT BREAST BX W LOC DEV 1ST LESION IMAGE BX SPEC STEREO GUIDE 08/25/2023 [facility]   COLONOSCOPY WITH PROPOFOL  N/A 03/09/2021   Procedure: COLONOSCOPY WITH PROPOFOL ;  Surgeon: Onita Elspeth Sharper, DO;  Location: [facility] ENDOSCOPY;  Service: Gastroenterology;  Laterality: N/A;   KIDNEY STONE SURGERY     LAPAROSCOPIC APPENDECTOMY N/A 11/23/2016    Procedure: APPENDECTOMY LAPAROSCOPIC;  Surgeon: Nicholaus Selinda Birmingham, MD;  Location: [facility] ORS;  Service: General;  Laterality: N/A;    Allergies  Allergen Reactions   Penicillins Other (See Comments)   Sulfa Antibiotics Hives    Current Outpatient Medications  Medication Sig Dispense Refill   hydrALAZINE (APRESOLINE) 50 MG tablet Take 50 mg by mouth 2 (two) times daily.     lisinopril (ZESTRIL) 40 MG tablet Take 40 mg by mouth.     methenamine (HIPREX) 1 g tablet Take 1 g by mouth 2 (two) times daily with a meal.     OZEMPIC, 0.25 OR 0.5 MG/DOSE, 2 MG/3ML SOPN Inject 0.25 mg into the skin.     PARoxetine (PAXIL) 10 MG tablet Take 10 mg daily by mouth.     simvastatin (ZOCOR) 40 MG tablet Take 40 mg by mouth at bedtime.     No current facility-administered medications for this visit.    Family History Family History  Problem Relation Age of Onset   Hypertension Mother    Lung cancer Mother    COPD Mother    Colon cancer Father    Breast cancer Maternal Grandmother 76   Emphysema Maternal Grandmother    Melanoma Maternal Grandmother       Social History Social History   Tobacco Use   Smoking status: Former    Current packs/day: 0.00    Types: Cigarettes    Quit  date: 06/05/2008    Years since quitting: 15.2   Smokeless tobacco: Never  Vaping Use   Vaping status: Never Used  Substance Use Topics   Alcohol use: Not Currently    Comment: Social   Drug use: No        Review of Systems  Constitutional: Negative.   HENT: Negative.    Eyes: Negative.   Respiratory: Negative.    Cardiovascular: Negative.   Gastrointestinal: Negative.   Genitourinary: Negative.   Skin: Negative.   Neurological: Negative.   Psychiatric/Behavioral: Negative.       Physical Exam Blood pressure (!) 168/87, pulse (!) 120, temperature 99 F (37.2 C), temperature source Oral, height 5' 4 (1.626 m), weight 238 lb 12.8 oz (108.3 kg), last menstrual period 07/07/2016, SpO2 96%. Last  Weight  Most recent update: 09/15/2023  9:20 AM    Weight  108.3 kg (238 lb 12.8 oz)             CONSTITUTIONAL: Well developed, and nourished, appropriately responsive and aware without distress.   EYES: Sclera non-icteric.   EARS, NOSE, MOUTH AND THROAT:  The oropharynx is clear. Oral mucosa is pink and moist.    Hearing is intact to voice.  NECK: Trachea is midline, and there is no jugular venous distension.  LYMPH NODES:  Lymph nodes in the neck are not appreciated. RESPIRATORY:  Lungs are clear, and breath sounds are equal bilaterally.  Normal respiratory effort without pathologic use of accessory muscles. CARDIOVASCULAR: Heart is regular in rate and rhythm.   Well perfused.  GI: The abdomen is  soft, nontender, and nondistended. There were no palpable masses. MUSCULOSKELETAL:  Symmetrical muscle tone appreciated in all four extremities.    SKIN: Skin turgor is normal. No pathologic skin lesions appreciated.  NEUROLOGIC:  Motor and sensation appear grossly normal.  Cranial nerves are grossly without defect. PSYCH:  Alert and oriented to person, place and time. Affect is appropriate for situation.  Data Reviewed I have personally reviewed what is currently available of the patient's imaging, recent labs and medical records.   Labs:     Latest Ref Rng & Units 09/07/2023   10:26 AM 11/24/2016    4:22 AM 11/23/2016    1:07 PM  CBC  WBC 4.0 - 10.5 K/uL 8.6  13.1  12.3   Hemoglobin 12.0 - 15.0 g/dL 85.4  87.1  86.0   Hematocrit 36.0 - 46.0 % 43.6  38.1  41.3   Platelets 150 - 400 K/uL 277  195  240       Latest Ref Rng & Units 09/07/2023   10:26 AM 11/23/2016    1:07 PM  CMP  Glucose 70 - 99 mg/dL 882  882   BUN 6 - 20 mg/dL 15  19   Creatinine 9.55 - 1.00 mg/dL 9.32  9.28   Sodium 864 - 145 mmol/L 135  137   Potassium 3.5 - 5.1 mmol/L 4.0  3.7   Chloride 98 - 111 mmol/L 101  101   CO2 22 - 32 mmol/L 23  24   Calcium 8.9 - 10.3 mg/dL 9.7  9.3   Total Protein 6.5 - 8.1 g/dL  8.0  8.0   Total Bilirubin 0.0 - 1.2 mg/dL 0.9  1.1   Alkaline Phos 38 - 126 U/L 70  98   AST 15 - 41 U/L 31  21   ALT 0 - 44 U/L 35  25     REPORT OF SURGICAL PATHOLOGY  FINAL DIAGNOSIS       1. 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 PATRICK REVIEWED THE CASE AND CONCURS WITH THE INTERPRETATION.  A BREAST      PROGNOSTIC PROFILE (ER AND PR) IS PENDING AND WILL BE REPORTED IN AN ADDENDUM.      LINDA NASH WAS NOTIFIED ON 08/26/2023.       2. 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      - NECROSIS: SINGLE CELL NECROSIS      - CALCIFICATIONS: PRESENT      - DCIS LENGTH: MULTIFOCAL FOCI EACH APPROXIMATELY 2 MM IN GREATEST LINEAR      DIMENSION PRESENT ON PARTS 2A, 2B, 2C AND 2D  PROGNOSTIC INDICATORS Results: IMMUNOHISTOCHEMICAL AND MORPHOMETRIC ANALYSIS PERFORMED MANUALLY Estrogen Receptor:  99%, POSITIVE, STRONG STAINING INTENSITY Progesterone Receptor:  40%, POSITIVE, MODERATE-STRONG STAINING INTENSITY REFERENCE RANGE ESTROGEN RECEPTOR NEGATIVE     0% POSITIVE       =>1% REFERENCE RANGE PROGESTERONE RECEPTOR NEGATIVE     0% POSITIVE        =>1% All controls stained appropriately Picklesimer Md, Fred , Sports administrator, International aid/development worker ( Signed 08 11 2025) Breast, right, needle core biopsy, inferior middle depth, x clip PROGNOSTIC INDICATORS  Results: IMMUNOHISTOCHEMICAL AND MORPHOMETRIC ANALYSIS PERFORMED MANUALLY Estrogen Receptor:  100%, POSITIVE, STRONG STAINING INTENSITY Progesterone Receptor:  40%, POSITIVE, MODERATE-STRONG STAINING INTENSITY REFERENCE RANGE ESTROGEN RECEPTOR NEGATIVE     0% POSITIVE       =>1% REFERENCE  RANGE PROGESTERONE RECEPTOR NEGATIVE     0% POSITIVE        =>1% All controls stained appropriately Picklesimer Md, Fred , Sports administrator, International aid/development worker ( Signed 08 11 2025)    Imaging: Radiological images personally reviewed:  CLINICAL DATA:  LEFT breast palpable   EXAM: DIGITAL DIAGNOSTIC BILATERAL MAMMOGRAM WITH TOMOSYNTHESIS AND CAD; ULTRASOUND RIGHT BREAST LIMITED; ULTRASOUND LEFT BREAST LIMITED   TECHNIQUE: Bilateral digital diagnostic mammography and breast tomosynthesis was performed. The images were evaluated with computer-aided detection. ; Targeted ultrasound examination of the right breast was performed; Targeted ultrasound examination of the left breast was performed.   COMPARISON:  Previous exam(s).   ACR Breast Density Category b: There are scattered areas of fibroglandular density.   FINDINGS: Spot compression tomosynthesis views were obtained over the palpable area of concern in the LEFT breast. No suspicious mammographic finding is identified in this area. There is focal skin thickening noted in this area. No suspicious mass, microcalcification, or other finding is identified in the LEFT breast.   Spot magnification views of the RIGHT breast demonstrate a dominant group of punctate calcifications in a linear distribution in the RIGHT lower breast at anterior depth which spans 17 mm. Additional loosely associated scattered and loosely grouped punctate calcifications in the lower outer breast at middle depth span approximately 5 cm inclusive of the dominant group.   On physical exam, there is a superficial dermal papule at the site of palpable concern in the LEFT upper outer breast. No  suspicious mass is appreciated in the RIGHT retroareolar breast.   Targeted LEFT breast ultrasound was performed in the palpable area of concern at the upper outer breast. There is an intradermal mass noted the site of palpable concern which is predominantly cystic  in appearance. It measures 6 x 3 by 6 mm. A tract is noted to the skin. This corresponds to the area of focal skin thickening noted mammographically.   Targeted ultrasound was performed of the RIGHT lower breast. At 6 o'clock in the retroareolar breast, there is a duct with internal echogenic foci most consistent with the calcifications at anterior depth noted mammographically. This area spans approximately 6 mm.   IMPRESSION: 1. There is a benign appearing intradermal mass at the site of palpable concern in the LEFT breast most consistent with an epidermal inclusion cyst. Recommend clinical management of this mass. 2. There is approximately 5 cm of indeterminate calcifications in the RIGHT breast with a dominant 17 mm group in the RIGHT lower breast at anterior depth. There is a favored sonographic correlate for these anterior calcifications at 6 o'clock in the retroareolar breast. Recommend ultrasound-guided biopsy of these calcifications at 6 o'clock followed by stereotactic guided biopsy of a second representative area of calcifications at middle depth for definitive characterization. 3. No mammographic evidence of malignancy in the LEFT breast.   RECOMMENDATION: 1. RIGHT breast ultrasound-guided biopsy x1 2. RIGHT breast stereotactic guided biopsy x1 (second stereotactic guided biopsy if deemed necessary due to ultrasound/mammogram discordance).   I have discussed the findings and recommendations with the patient. The biopsy procedure was discussed with the patient and questions were answered. Patient expressed their understanding of the biopsy recommendation. Patient will be scheduled for biopsy at her earliest convenience by the schedulers. Ordering provider will be notified. If applicable, a reminder letter will be sent to the patient regarding the next appointment.   BI-RADS CATEGORY  4: Suspicious.     Electronically Signed   By: Corean Salter M.D.   On:  08/12/2023 14:51  CLINICAL DATA:  RIGHT breast inferior calcifications were ultrasound and stereotactic biopsy   EXAM: ULTRASOUND GUIDED RIGHT BREAST CORE NEEDLE BIOPSY and STEREOTACTIC GUIDED RIGHT BREAST CORE NEEDLE BIOPSY   COMPARISON:  Previous exam(s).   PROCEDURE: Site 1: RIGHT breast 6 o'clock RETROAREOLAR calcifications, ultrasound-guided, anterior depth, Venus clip   I met with the patient and we discussed the procedure of ultrasound-guided biopsy, including benefits and alternatives. We discussed the high likelihood of a successful procedure. We discussed the risks of the procedure, including infection, bleeding, tissue injury, clip migration, and inadequate sampling. Informed written consent was given. The usual time-out protocol was performed immediately prior to the procedure.   Lesion quadrant: LOWER OUTER   Using sterile technique and 1% lidocaine  and 1% lidocaine  with epinephrine  as local anesthetic, under direct ultrasound visualization, a 14 gauge spring-loaded device was used to perform biopsy of the RIGHT breast 6 o'clock calcifications RETROAREOLAR position using a inferior approach. At the conclusion of the procedure Venus shaped tissue marker clip was deployed into the biopsy cavity.   Site 2: RIGHT breast inferior calcifications, stereotactic guided, middle depth, X clip   The patient and I discussed the procedure of stereotactic-guided biopsy including benefits and alternatives. We discussed the high likelihood of a successful procedure. We discussed the risks of the procedure including infection, bleeding, tissue injury, clip migration, and inadequate sampling. Informed written consent was given. The usual time out protocol was performed immediately prior to the procedure.  Using sterile technique and 1% lidocaine  and 1% lidocaine  with epinephrine  as local anesthetic, under stereotactic guidance, a 9 gauge vacuum assisted device was used to  perform core needle biopsy of calcifications in the lower outer quadrant of the RIGHT breast using a LATERAL approach. Specimen radiograph was performed showing representative calcifications. Specimens with calcifications are identified for pathology.   Lesion quadrant: Lower outer   At the conclusion of the procedure, X shaped tissue marker clip was deployed into the biopsy cavity.   Follow-up 2-view mammogram was performed and dictated separately.   IMPRESSION: Ultrasound guided biopsy and stereotactic guided biopsy of calcifications in the inferior RIGHT breast. No apparent complications.   Electronically Signed: By: Norleen Croak M.D. On: 08/25/2023 09:00  CLINICAL DATA:  Status post same day ultrasound and stereotactic guided biopsies of RIGHT breast calcifications   EXAM: 3D DIAGNOSTIC RIGHT MAMMOGRAM POST ULTRASOUND and STEREOTACTIC BIOPSIES   COMPARISON:  Previous exam(s).   ACR Breast Density Category b: There are scattered areas of fibroglandular density.   FINDINGS: 3D Mammographic images were obtained following ultrasound and stereotactic guided biopsies of calcifications in the inferior RIGHT breast. The Venus clip is in expected location at the site of ultrasound-guided, anterior depth biopsy. The X shaped clip is in appropriate position at the posterior extent of the calcifications at the site of middle depth biopsy.   IMPRESSION: Appropriate positioning of biopsy marking clips at the sites of biopsy in the inferior RIGHT breast, as above.   Final Assessment: Post Procedure Mammograms for Marker Placement     Electronically Signed   By: Norleen Croak M.D.   On: 08/25/2023 09:04  CLINICAL DATA:  Patient is status post 2 site biopsy which demonstrated ductal carcinoma in-situ. Evaluation of the RIGHT axilla per treatment team request.   EXAM: ULTRASOUND OF THE RIGHT AXILLA   COMPARISON:  Previous exams.   FINDINGS: Targeted ultrasound was  performed of the RIGHT axilla. No suspicious axillary lymph nodes are visualized. Lymph nodes demonstrate preserved echogenic hila and thin smooth cortices.   IMPRESSION: No suspicious RIGHT axillary adenopathy.   RECOMMENDATION: Recommend continued clinical and surgical management of biopsy proven RIGHT breast ductal carcinoma in-situ.   I have discussed the findings and recommendations with the patient. If applicable, a reminder letter will be sent to the patient regarding the next appointment.   BI-RADS CATEGORY  2: Benign.     Electronically Signed   By: Corean Salter M.D.   On: 09/15/2023 10:36 Within last 24 hrs: No results found.  Assessment     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   Ductal carcinoma in situ (DCIS) of right breast 09/07/2023   Family history of cancer 09/07/2023   Former smoker 11/26/2016   Nephrolithiasis 11/26/2016   Acute appendicitis with rupture 11/23/2016   Morbid obesity with BMI of 40.0-44.9, adult (HCC) 12/30/2015   Left ureteral stone 12/28/2015   Hypercholesterolemia 11/22/2014   Vitamin B 12 deficiency 04/22/2014   Carpal tunnel syndrome, bilateral 04/19/2014   Dyshidrotic hand dermatitis 04/19/2014   Paresthesia 04/19/2014   Dysuria 05/23/2013   Urinary symptom or sign 05/23/2013   Excess ear wax 11/15/2012   Hypertension 11/15/2012   Prediabetes 10/30/2012    Plan    I ensured that she was well aware of her surgical options, including those of mastectomy with immediate reconstruction. I believe due to the proximity of these lesions and their location, that breast conservation  is certainly a feasible option, and ensured that she understood the pros and cons of this choice. Though it may be prudent to proceed with sentinel lymph node, considering the possible upstaging to an invasive cancer.  I discussed the available options with the patient. The risk of recurrence is  similar between mastectomy and lumpectomy with radiation.  I also discussed that given the small size of the cancer would recommend localization lumpectomy with radiation to follow.   I did raise the concern of how superficial and proximate to the nipple or anterior lesion is, we will attempt nipple/areolar preservation however this may be a source of positive margins.  I also discussed that the need to do a sentinel lymph node biopsy to check the nodes, is debatable.   Explained to the patient that after her surgical treatment additional treatment will depend on her prognostic indicators and stage.    I discussed risks of bleeding, infection, damage to surrounding tissues, having positive margins, needing further resection, damage to nerves causing arm numbness or difficulty raising arm, causing lymphedema in the arm; as well as anesthesia risks of MI, stroke, prolonged ventilation, pulmonary embolism, thrombosis and even death.   Patient was given the opportunity to ask questions and have them answered.  They would like to proceed with right breast bracketed Scout tag localized lumpectomy and defer axillary sentinel lymph node biopsy.    I personally spent a total of 60 minutes in the care of the patient today including preparing to see the patient, getting/reviewing separately obtained history, performing a medically appropriate exam/evaluation, counseling and educating, placing orders, documenting clinical information in the EHR, independently interpreting results, and coordinating care.   These notes generated with voice recognition software. I apologize for typographical errors.  Honor Leghorn M.D., FACS 09/15/2023, 12:30 PM

## 2023-09-15 ENCOUNTER — Ambulatory Visit: Payer: Self-pay | Admitting: Surgery

## 2023-09-15 ENCOUNTER — Ambulatory Visit
Admission: RE | Admit: 2023-09-15 | Discharge: 2023-09-15 | Disposition: A | Discharge status: Home / Self Care | Source: Ambulatory Visit | Attending: Oncology | Admitting: Oncology

## 2023-09-15 ENCOUNTER — Ambulatory Visit: Admitting: Surgery

## 2023-09-15 ENCOUNTER — Other Ambulatory Visit: Payer: Self-pay | Admitting: Surgery

## 2023-09-15 ENCOUNTER — Encounter: Payer: Self-pay | Admitting: Surgery

## 2023-09-15 VITALS — BP 168/87 | HR 120 | Temp 99.0°F | Ht 64.0 in | Wt 238.8 lb

## 2023-09-15 DIAGNOSIS — D0511 Intraductal carcinoma in situ of right breast: Secondary | ICD-10-CM

## 2023-09-15 DIAGNOSIS — D493 Neoplasm of unspecified behavior of breast: Secondary | ICD-10-CM

## 2023-09-15 NOTE — Patient Instructions (Signed)
 We have spoken today about removing a lump in your breast. This will be done by Dr. Claudine Mouton at [facility].  If you are on any injectable weight loss medication, you will need to stop taking your GLP-1 injectable (weight loss) medications 8 days before your surgery to avoid any complications with anesthesia.   You will most likely be able to leave the hospital several hours after your surgery. Rarely, a patient needs to stay over night but this is a possibility.  Plan to tentatively be off work for 1-2 weeks following the surgery and may return with approximately 2 more weeks of a lifting restriction, no greater than 15 lbs.  Please see your Blue surgery sheet for more information. Our surgery scheduler will call you to look at surgery dates and to go over information.   If you have FMLA or Disability paperwork that needs to be filled out, please have your company fax your paperwork to [phone] or you may drop this by either office. This paperwork will be filled out within 3 days after your surgery has been completed.    Lumpectomy A lumpectomy is a form of "breast conserving" or "breast preservation" surgery. It may also be referred to as a partial mastectomy. During a lumpectomy, the portion of the breast that contains the cancerous tumor or breast mass (the lump) is removed. Some normal tissue around the lump may also be removed to make sure all of the tumor has been removed.  LET [facility] CARE PROVIDER KNOW ABOUT: Any allergies you have. All medicines you are taking, including vitamins, herbs, eye drops, creams, and over-the-counter medicines. Previous problems you or members of your family have had with the use of anesthetics. Any blood disorders you have. Previous surgeries you have had. Medical conditions you have. RISKS AND COMPLICATIONS Generally, this is a safe procedure. However, problems can occur and include: Bleeding. Infection. Pain. Temporary swelling. Change in the  shape of the breast, particularly if a large portion is removed. BEFORE THE PROCEDURE Ask your health care provider about changing or stopping your regular medicines. This is especially important if you are taking diabetes medicines or blood thinners. Do not eat or drink anything after midnight on the night before the procedure or as directed by your health care provider. Ask your health care provider if you can take a sip of water with any approved medicines. On the day of surgery, your health care provider will use a mammogram or ultrasound to locate and mark the tumor in your breast. These markings on your breast will show where the cut (incision) will be made.   PROCEDURE  An IV tube will be put into one of your veins. You may be given medicine to help you relax before the surgery (sedative). You will be given one of the following: A medicine that numbs the area (local anesthetic). A medicine that makes you fall asleep (general anesthetic). Your health care provider will use a kind of electric scalpel that uses heat to minimize bleeding (electrocautery knife). A curved incision (like a smile or frown) that follows the natural curve of your breast is made, to allow for minimal scarring and better healing. The tumor will be removed with some of the surrounding tissue. This will be sent to the lab for analysis. Your health care provider may also remove your lymph nodes at this time if needed. Sometimes, but not always, a rubber tube called a drain will be surgically inserted into your breast area or  armpit to collect excess fluid that may accumulate in the space where the tumor was. This drain is connected to a plastic bulb on the outside of your body. This drain creates suction to help remove the fluid. The incisions will be closed with stitches (sutures). A bandage may be placed over the incisions. AFTER THE PROCEDURE You will be taken to the recovery area. You will be given medicine for  pain. A small rubber drain may be placed in the breast for 2-3 days to prevent a collection of blood (hematoma) from developing in the breast. You will be given instructions on caring for the drain before you go home. A pressure bandage (dressing) will be applied for 1-2 days to prevent bleeding. Ask your health care provider how to care for your bandage at home.   This information is not intended to replace advice given to you by your health care provider. Make sure you discuss any questions you have with your health care provider.   Document Released: 02/15/2006 Document Revised: 01/25/2014 Document Reviewed: 06/09/2012 Elsevier Interactive Patient Education [employer]

## 2023-09-20 ENCOUNTER — Encounter: Payer: Self-pay | Admitting: *Deleted

## 2023-09-20 NOTE — Progress Notes (Signed)
 Lumpectomy is scheduled for 9/10.  She will see Dr. Babara and Dr. Lenn on 10/1.

## 2023-09-21 ENCOUNTER — Inpatient Hospital Stay

## 2023-09-21 ENCOUNTER — Ambulatory Visit
Admission: RE | Admit: 2023-09-21 | Discharge: 2023-09-21 | Disposition: A | Discharge status: Home / Self Care | Source: Ambulatory Visit | Attending: Surgery | Admitting: Surgery

## 2023-09-21 ENCOUNTER — Inpatient Hospital Stay: Admission: RE | Admit: 2023-09-21 | Source: Ambulatory Visit

## 2023-09-21 ENCOUNTER — Telehealth: Payer: Self-pay | Admitting: Surgery

## 2023-09-21 DIAGNOSIS — D0511 Intraductal carcinoma in situ of right breast: Secondary | ICD-10-CM | POA: Insufficient documentation

## 2023-09-21 HISTORY — PX: BREAST BIOPSY: SHX20

## 2023-09-21 MED ORDER — LIDOCAINE 1 % OPTIME INJ - NO CHARGE
5.0000 mL | Freq: Once | INTRAMUSCULAR | Status: AC
  Administered 2023-09-21: 5 mL
  Filled 2023-09-21: qty 6

## 2023-09-21 MED ORDER — LIDOCAINE HCL 1 % IJ SOLN
5.0000 mL | Freq: Once | INTRAMUSCULAR | Status: AC
  Administered 2023-09-21: 5 mL
  Filled 2023-09-21: qty 5

## 2023-09-21 NOTE — Telephone Encounter (Signed)
 Patient has been advised of Pre-Admission date/time, and Surgery date at [facility].  Surgery Date: 09/28/23 Preadmission Testing Date: 09/22/23 (phone 1p-4p)  Patient informed of the scheduling process and surgery information given at time of office visit.   Patient has been made aware to call [phone], between 1-3:00pm the day before surgery, to find out what time to arrive for surgery.

## 2023-09-22 ENCOUNTER — Other Ambulatory Visit: Payer: Self-pay

## 2023-09-22 ENCOUNTER — Encounter
Admission: RE | Admit: 2023-09-22 | Discharge: 2023-09-22 | Disposition: A | Discharge status: Home / Self Care | Source: Ambulatory Visit | Attending: Surgery | Admitting: Surgery

## 2023-09-22 DIAGNOSIS — D0511 Intraductal carcinoma in situ of right breast: Secondary | ICD-10-CM

## 2023-09-22 DIAGNOSIS — I10 Essential (primary) hypertension: Secondary | ICD-10-CM

## 2023-09-22 DIAGNOSIS — Z01810 Encounter for preprocedural cardiovascular examination: Secondary | ICD-10-CM

## 2023-09-22 HISTORY — DX: Obesity, unspecified: E66.9

## 2023-09-22 HISTORY — DX: Anxiety disorder, unspecified: F41.9

## 2023-09-22 HISTORY — DX: Intraductal carcinoma in situ of right breast: D05.11

## 2023-09-22 HISTORY — DX: Type 2 diabetes mellitus without complications: E11.9

## 2023-09-22 NOTE — Patient Instructions (Signed)
 Your procedure is scheduled on:09-28-23 Wednesday Report to the Registration Desk on the 1st floor of the Medical Mall.Then proceed to the 2nd floor Surgery Desk To find out your arrival time, please call [phone] between 1PM - 3PM on:09-27-23 Tuesday If your arrival time is 6:00 am, do not arrive before that time as the Medical Mall entrance doors do not open until 6:00 am.  REMEMBER: Instructions that are not followed completely may result in serious medical risk, up to and including death; or upon the discretion of your surgeon and anesthesiologist your surgery may need to be rescheduled.  Do not eat food Or drink liquids after midnight the night before surgery.  No gum chewing or hard candies.  One week prior to surgery:Stop NOW (09-22-23) Stop Anti-inflammatories (NSAIDS) such as Advil, Aleve, Ibuprofen, Motrin, Naproxen, Naprosyn and Aspirin based products such as Excedrin, Goody's Powder, BC Powder. Stop ANY OVER THE COUNTER supplements until after surgery  You may however, continue to take Tylenol  if needed for pain up until the day of surgery.  Stop OZEMPIC 7 days prior to surgery-Do NOT take again until AFTER surgery  Continue taking all of your other prescription medications up until the day of surgery.  ON THE DAY OF SURGERY ONLY TAKE THESE MEDICATIONS WITH SIPS OF WATER: -hydrALAZINE (APRESOLINE)   No Alcohol for 24 hours before or after surgery.  No Smoking including e-cigarettes for 24 hours before surgery.  No chewable tobacco products for at least 6 hours before surgery.  No nicotine patches on the day of surgery.  Do not use any recreational drugs for at least a week (preferably 2 weeks) before your surgery.  Please be advised that the combination of cocaine and anesthesia may have negative outcomes, up to and including death. If you test positive for cocaine, your surgery will be cancelled.  On the morning of surgery brush your teeth with toothpaste and water,  you may rinse your mouth with mouthwash if you wish. Do not swallow any toothpaste or mouthwash.  Use CHG Soap as directed on instruction sheet.  Do not wear jewelry, make-up, hairpins, clips or nail polish.  For welded (permanent) jewelry: bracelets, anklets, waist bands, etc.  Please have this removed prior to surgery.  If it is not removed, there is a chance that hospital personnel will need to cut it off on the day of surgery.  Do not wear lotions, powders, or perfumes.   Do not shave body hair from the neck down 48 hours before surgery.  Contact lenses, hearing aids and dentures may not be worn into surgery.  Do not bring valuables to the hospital. [facility] is not responsible for any missing/lost belongings or valuables.   Notify your doctor if there is any change in your medical condition (cold, fever, infection).  Wear comfortable clothing (specific to your surgery type) to the hospital.  After surgery, you can help prevent lung complications by doing breathing exercises.  Take deep breaths and cough every 1-2 hours. Your doctor may order a device called an Incentive Spirometer to help you take deep breaths. When coughing or sneezing, hold a pillow firmly against your incision with both hands. This is called "splinting." Doing this helps protect your incision. It also decreases belly discomfort.  If you are being admitted to the hospital overnight, leave your suitcase in the car. After surgery it may be brought to your room.  In case of increased patient census, it may be necessary for you, the patient,  to continue your postoperative care in the Same Day Surgery department.  If you are being discharged the day of surgery, you will not be allowed to drive home. You will need a responsible individual to drive you home and stay with you for 24 hours after surgery.   If you are taking public transportation, you will need to have a responsible individual with you.  Please call  the Pre-admissions Testing Dept. at [phone] if you have any questions about these instructions.  Surgery Visitation Policy:  Patients having surgery or a procedure may have two visitors.  Children under the age of 83 must have an adult with them who is not the patient.                                                                                                             Preparing for Surgery with CHLORHEXIDINE GLUCONATE (CHG) Soap  Chlorhexidine Gluconate (CHG) Soap  o An antiseptic cleaner that kills germs and bonds with the skin to continue killing germs even after washing  o Used for showering the night before surgery and morning of surgery  Before surgery, you can play an important role by reducing the number of germs on your skin.  CHG (Chlorhexidine gluconate) soap is an antiseptic cleanser which kills germs and bonds with the skin to continue killing germs even after washing.  Please do not use if you have an allergy to CHG or antibacterial soaps. If your skin becomes reddened/irritated stop using the CHG.  1. Shower the NIGHT BEFORE SURGERY and the MORNING OF SURGERY with CHG soap.  2. If you choose to wash your hair, wash your hair first as usual with your normal shampoo.  3. After shampooing, rinse your hair and body thoroughly to remove the shampoo.  4. Use CHG as you would any other liquid soap. You can apply CHG directly to the skin and wash gently with a scrungie or a clean washcloth.  5. Apply the CHG soap to your body only from the neck down. Do not use on open wounds or open sores. Avoid contact with your eyes, ears, mouth, and genitals (private parts). Wash face and genitals (private parts) with your normal soap.  6. Wash thoroughly, paying special attention to the area where your surgery will be performed.  7. Thoroughly rinse your body with warm water.  8. Do not shower/wash with your normal soap after using and rinsing off the CHG soap.  9. Pat  yourself dry with a clean towel.  10. Wear clean pajamas to bed the night before surgery.  12. Place clean sheets on your bed the night of your first shower and do not sleep with pets.  13. Shower again with the CHG soap on the day of surgery prior to arriving at the hospital.  14. Do not apply any deodorants/lotions/powders.  15. Please wear clean clothes to the hospital.   [employer] to address health-related social needs:  [email or web address]

## 2023-09-26 ENCOUNTER — Encounter
Admission: RE | Admit: 2023-09-26 | Discharge: 2023-09-26 | Disposition: A | Discharge status: Home / Self Care | Source: Ambulatory Visit | Attending: Surgery | Admitting: Surgery

## 2023-09-26 DIAGNOSIS — I10 Essential (primary) hypertension: Secondary | ICD-10-CM | POA: Diagnosis not present

## 2023-09-26 DIAGNOSIS — Z01810 Encounter for preprocedural cardiovascular examination: Secondary | ICD-10-CM | POA: Insufficient documentation

## 2023-09-26 DIAGNOSIS — Z01818 Encounter for other preprocedural examination: Secondary | ICD-10-CM | POA: Diagnosis present

## 2023-09-28 ENCOUNTER — Other Ambulatory Visit: Payer: Self-pay

## 2023-09-28 ENCOUNTER — Ambulatory Visit: Admitting: Registered Nurse

## 2023-09-28 ENCOUNTER — Encounter
Admission: RE | Disposition: A | Discharge status: Home / Self Care | Payer: Self-pay | Source: Home / Self Care | Attending: Surgery

## 2023-09-28 ENCOUNTER — Encounter: Payer: Self-pay | Admitting: Surgery

## 2023-09-28 ENCOUNTER — Ambulatory Visit
Admission: RE | Admit: 2023-09-28 | Discharge: 2023-09-28 | Disposition: A | Discharge status: Home / Self Care | Source: Ambulatory Visit | Attending: Surgery | Admitting: Surgery

## 2023-09-28 ENCOUNTER — Ambulatory Visit
Admission: RE | Admit: 2023-09-28 | Discharge: 2023-09-28 | Disposition: A | Discharge status: Home / Self Care | Attending: Surgery | Admitting: Surgery

## 2023-09-28 DIAGNOSIS — Z6841 Body Mass Index (BMI) 40.0 and over, adult: Secondary | ICD-10-CM | POA: Insufficient documentation

## 2023-09-28 DIAGNOSIS — E66813 Obesity, class 3: Secondary | ICD-10-CM | POA: Diagnosis not present

## 2023-09-28 DIAGNOSIS — Z87891 Personal history of nicotine dependence: Secondary | ICD-10-CM | POA: Insufficient documentation

## 2023-09-28 DIAGNOSIS — D0511 Intraductal carcinoma in situ of right breast: Secondary | ICD-10-CM | POA: Insufficient documentation

## 2023-09-28 DIAGNOSIS — N189 Chronic kidney disease, unspecified: Secondary | ICD-10-CM | POA: Diagnosis not present

## 2023-09-28 DIAGNOSIS — Z803 Family history of malignant neoplasm of breast: Secondary | ICD-10-CM | POA: Insufficient documentation

## 2023-09-28 DIAGNOSIS — E119 Type 2 diabetes mellitus without complications: Secondary | ICD-10-CM | POA: Insufficient documentation

## 2023-09-28 DIAGNOSIS — I129 Hypertensive chronic kidney disease with stage 1 through stage 4 chronic kidney disease, or unspecified chronic kidney disease: Secondary | ICD-10-CM | POA: Insufficient documentation

## 2023-09-28 DIAGNOSIS — Z780 Asymptomatic menopausal state: Secondary | ICD-10-CM | POA: Insufficient documentation

## 2023-09-28 HISTORY — PX: BREAST LUMPECTOMY WITH RADIO FREQUENCY LOCALIZER: SHX6897

## 2023-09-28 LAB — GLUCOSE, CAPILLARY
Glucose-Capillary: 108 mg/dL — ABNORMAL HIGH (ref 70–99)
Glucose-Capillary: 140 mg/dL — ABNORMAL HIGH (ref 70–99)

## 2023-09-28 SURGERY — BREAST LUMPECTOMY WITH RADIO FREQUENCY LOCALIZER
Anesthesia: General | Site: Breast | Laterality: Right

## 2023-09-28 MED ORDER — DROPERIDOL 2.5 MG/ML IJ SOLN: 0.6250 mg | Freq: Once | INTRAMUSCULAR | Status: DC | PRN

## 2023-09-28 MED ORDER — ACETAMINOPHEN 500 MG PO TABS
ORAL_TABLET | ORAL | Status: AC
  Filled 2023-09-28: qty 2

## 2023-09-28 MED ORDER — PROPOFOL 1000 MG/100ML IV EMUL
INTRAVENOUS | Status: AC
Start: 2023-09-28 — End: 2023-09-28
  Filled 2023-09-28: qty 100

## 2023-09-28 MED ORDER — PROPOFOL 10 MG/ML IV BOLUS
INTRAVENOUS | Status: AC
  Filled 2023-09-28: qty 20

## 2023-09-28 MED ORDER — EPHEDRINE SULFATE-NACL 50-0.9 MG/10ML-% IV SOSY
PREFILLED_SYRINGE | INTRAVENOUS | Status: DC | PRN
  Administered 2023-09-28: 5 mg via INTRAVENOUS

## 2023-09-28 MED ORDER — LIDOCAINE HCL (CARDIAC) PF 100 MG/5ML IV SOSY
PREFILLED_SYRINGE | INTRAVENOUS | Status: DC | PRN
  Administered 2023-09-28: 80 mg via INTRAVENOUS

## 2023-09-28 MED ORDER — ALBUTEROL SULFATE HFA 108 (90 BASE) MCG/ACT IN AERS
INHALATION_SPRAY | RESPIRATORY_TRACT | Status: AC
  Filled 2023-09-28: qty 6.7

## 2023-09-28 MED ORDER — CELECOXIB 200 MG PO CAPS
ORAL_CAPSULE | ORAL | Status: AC
  Filled 2023-09-28: qty 1

## 2023-09-28 MED ORDER — HYDROCODONE-ACETAMINOPHEN 5-325 MG PO TABS: 1.0000 | ORAL_TABLET | Freq: Four times a day (QID) | ORAL | 0 refills | Status: AC | PRN

## 2023-09-28 MED ORDER — CEFAZOLIN SODIUM-DEXTROSE 2-4 GM/100ML-% IV SOLN
2.0000 g | INTRAVENOUS | Status: AC
  Administered 2023-09-28: 2 g via INTRAVENOUS

## 2023-09-28 MED ORDER — MIDAZOLAM HCL 2 MG/2ML IJ SOLN
INTRAMUSCULAR | Status: DC | PRN
Start: 2023-09-28 — End: 2023-09-28
  Administered 2023-09-28: 2 mg via INTRAVENOUS

## 2023-09-28 MED ORDER — CHLORHEXIDINE GLUCONATE 0.12 % MT SOLN
OROMUCOSAL | Status: AC
Start: 2023-09-28 — End: 2023-09-28
  Filled 2023-09-28: qty 15

## 2023-09-28 MED ORDER — CHLORHEXIDINE GLUCONATE CLOTH 2 % EX PADS: 6.0000 | MEDICATED_PAD | Freq: Once | CUTANEOUS | Status: DC

## 2023-09-28 MED ORDER — CEFAZOLIN SODIUM-DEXTROSE 2-4 GM/100ML-% IV SOLN
INTRAVENOUS | Status: AC
  Filled 2023-09-28: qty 100

## 2023-09-28 MED ORDER — DEXAMETHASONE SODIUM PHOSPHATE 10 MG/ML IJ SOLN
INTRAMUSCULAR | Status: DC | PRN
  Administered 2023-09-28: 5 mg via INTRAVENOUS

## 2023-09-28 MED ORDER — PHENYLEPHRINE HCL-NACL 20-0.9 MG/250ML-% IV SOLN
INTRAVENOUS | Status: AC
  Filled 2023-09-28: qty 250

## 2023-09-28 MED ORDER — LACTATED RINGERS IV SOLN: INTRAVENOUS | Status: DC | PRN

## 2023-09-28 MED ORDER — MIDAZOLAM HCL 2 MG/2ML IJ SOLN
INTRAMUSCULAR | Status: AC
  Filled 2023-09-28: qty 2

## 2023-09-28 MED ORDER — GABAPENTIN 300 MG PO CAPS
300.0000 mg | ORAL_CAPSULE | ORAL | Status: AC
  Administered 2023-09-28: 300 mg via ORAL

## 2023-09-28 MED ORDER — GABAPENTIN 300 MG PO CAPS
ORAL_CAPSULE | ORAL | Status: AC
  Filled 2023-09-28: qty 1

## 2023-09-28 MED ORDER — BUPIVACAINE LIPOSOME 1.3 % IJ SUSP: 20.0000 mL | Freq: Once | INTRAMUSCULAR | Status: DC

## 2023-09-28 MED ORDER — SUCCINYLCHOLINE CHLORIDE 200 MG/10ML IV SOSY
PREFILLED_SYRINGE | INTRAVENOUS | Status: AC
  Filled 2023-09-28: qty 10

## 2023-09-28 MED ORDER — DEXMEDETOMIDINE HCL IN NACL 80 MCG/20ML IV SOLN
INTRAVENOUS | Status: DC | PRN
  Administered 2023-09-28 (×2): 10 ug via INTRAVENOUS

## 2023-09-28 MED ORDER — CELECOXIB 200 MG PO CAPS
200.0000 mg | ORAL_CAPSULE | ORAL | Status: AC
  Administered 2023-09-28: 200 mg via ORAL

## 2023-09-28 MED ORDER — OXYCODONE HCL 5 MG PO TABS
ORAL_TABLET | ORAL | Status: AC
  Filled 2023-09-28: qty 1

## 2023-09-28 MED ORDER — ALBUTEROL SULFATE HFA 108 (90 BASE) MCG/ACT IN AERS
INHALATION_SPRAY | RESPIRATORY_TRACT | Status: DC | PRN
  Administered 2023-09-28: 4 via RESPIRATORY_TRACT
  Administered 2023-09-28: 6 via RESPIRATORY_TRACT

## 2023-09-28 MED ORDER — SODIUM CHLORIDE 0.9 % IV SOLN: INTRAVENOUS | Status: DC | PRN

## 2023-09-28 MED ORDER — FENTANYL CITRATE (PF) 100 MCG/2ML IJ SOLN
INTRAMUSCULAR | Status: AC
  Filled 2023-09-28: qty 2

## 2023-09-28 MED ORDER — DEXAMETHASONE SODIUM PHOSPHATE 10 MG/ML IJ SOLN
INTRAMUSCULAR | Status: AC
  Filled 2023-09-28: qty 1

## 2023-09-28 MED ORDER — CHLORHEXIDINE GLUCONATE 0.12 % MT SOLN
15.0000 mL | Freq: Once | OROMUCOSAL | Status: AC
  Administered 2023-09-28: 15 mL via OROMUCOSAL

## 2023-09-28 MED ORDER — PROPOFOL 10 MG/ML IV BOLUS
INTRAVENOUS | Status: DC | PRN
  Administered 2023-09-28: 175 ug/kg/min via INTRAVENOUS
  Administered 2023-09-28: 150 mg via INTRAVENOUS
  Administered 2023-09-28 (×2): 50 mg via INTRAVENOUS

## 2023-09-28 MED ORDER — ONDANSETRON HCL 4 MG/2ML IJ SOLN
INTRAMUSCULAR | Status: AC
  Filled 2023-09-28: qty 2

## 2023-09-28 MED ORDER — FENTANYL CITRATE (PF) 100 MCG/2ML IJ SOLN: 25.0000 ug | INTRAMUSCULAR | Status: DC | PRN

## 2023-09-28 MED ORDER — ONDANSETRON HCL 4 MG/2ML IJ SOLN
INTRAMUSCULAR | Status: DC | PRN
Start: 2023-09-28 — End: 2023-09-28
  Administered 2023-09-28: 4 mg via INTRAVENOUS

## 2023-09-28 MED ORDER — EPHEDRINE 5 MG/ML INJ
INTRAVENOUS | Status: AC
  Filled 2023-09-28: qty 5

## 2023-09-28 MED ORDER — OXYCODONE HCL 5 MG PO TABS
5.0000 mg | ORAL_TABLET | Freq: Once | ORAL | Status: AC | PRN
  Administered 2023-09-28: 5 mg via ORAL

## 2023-09-28 MED ORDER — BUPIVACAINE-EPINEPHRINE (PF) 0.25% -1:200000 IJ SOLN
INTRAMUSCULAR | Status: DC | PRN
  Administered 2023-09-28: 30 mL

## 2023-09-28 MED ORDER — ACETAMINOPHEN 10 MG/ML IV SOLN: 1000.0000 mg | Freq: Once | INTRAVENOUS | Status: DC | PRN

## 2023-09-28 MED ORDER — PHENYLEPHRINE 80 MCG/ML (10ML) SYRINGE FOR IV PUSH (FOR BLOOD PRESSURE SUPPORT)
PREFILLED_SYRINGE | INTRAVENOUS | Status: DC | PRN
  Administered 2023-09-28 (×2): 80 ug via INTRAVENOUS
  Administered 2023-09-28 (×4): 160 ug via INTRAVENOUS

## 2023-09-28 MED ORDER — ACETAMINOPHEN 500 MG PO TABS
1000.0000 mg | ORAL_TABLET | ORAL | Status: AC
  Administered 2023-09-28: 1000 mg via ORAL

## 2023-09-28 MED ORDER — SODIUM CHLORIDE 0.9 % IV SOLN: INTRAVENOUS | Status: DC

## 2023-09-28 MED ORDER — LACTATED RINGERS IV SOLN: INTRAVENOUS | Status: DC

## 2023-09-28 MED ORDER — FENTANYL CITRATE (PF) 100 MCG/2ML IJ SOLN
INTRAMUSCULAR | Status: DC | PRN
  Administered 2023-09-28 (×2): 50 ug via INTRAVENOUS

## 2023-09-28 MED ORDER — PHENYLEPHRINE HCL-NACL 20-0.9 MG/250ML-% IV SOLN
INTRAVENOUS | Status: DC | PRN
  Administered 2023-09-28: 40 ug/min via INTRAVENOUS

## 2023-09-28 MED ORDER — ORAL CARE MOUTH RINSE: 15.0000 mL | Freq: Once | OROMUCOSAL | Status: AC

## 2023-09-28 MED ORDER — SUCCINYLCHOLINE CHLORIDE 200 MG/10ML IV SOSY
PREFILLED_SYRINGE | INTRAVENOUS | Status: DC | PRN
  Administered 2023-09-28: 120 mg via INTRAVENOUS

## 2023-09-28 MED ORDER — CHLORHEXIDINE GLUCONATE CLOTH 2 % EX PADS
6.0000 | MEDICATED_PAD | Freq: Once | CUTANEOUS | Status: AC
  Administered 2023-09-28: 6 via TOPICAL

## 2023-09-28 MED ORDER — LIDOCAINE HCL (PF) 2 % IJ SOLN
INTRAMUSCULAR | Status: AC
  Filled 2023-09-28: qty 5

## 2023-09-28 MED ORDER — BUPIVACAINE-EPINEPHRINE (PF) 0.25% -1:200000 IJ SOLN
INTRAMUSCULAR | Status: AC
  Filled 2023-09-28: qty 30

## 2023-09-28 MED ORDER — OXYCODONE HCL 5 MG/5ML PO SOLN: 5.0000 mg | Freq: Once | ORAL | Status: AC | PRN

## 2023-09-28 MED ORDER — PROPOFOL 1000 MG/100ML IV EMUL
INTRAVENOUS | Status: AC
  Filled 2023-09-28: qty 100

## 2023-09-28 SURGICAL SUPPLY — 27 items
BLADE SURG 15 STRL LF DISP TIS (BLADE) ×1 IMPLANT
CHLORAPREP W/TINT 26 (MISCELLANEOUS) IMPLANT
CLIP APPLIE 9.375 SM OPEN (CLIP) IMPLANT
CNTNR URN SCR LID CUP LEK RST (MISCELLANEOUS) IMPLANT
DERMABOND ADVANCED .7 DNX12 (GAUZE/BANDAGES/DRESSINGS) ×1 IMPLANT
DEVICE DUBIN SPECIMEN MAMMOGRA (MISCELLANEOUS) ×1 IMPLANT
DRAPE LAPAROTOMY TRNSV 106X77 (MISCELLANEOUS) ×1 IMPLANT
ELECTRODE CAUTERY BLDE TIP 2.5 (TIP) ×1 IMPLANT
ELECTRODE REM PT RTRN 9FT ADLT (ELECTROSURGICAL) ×1 IMPLANT
GLOVE ORTHO TXT STRL SZ7.5 (GLOVE) ×1 IMPLANT
GOWN STRL REUS W/ TWL LRG LVL3 (GOWN DISPOSABLE) ×1 IMPLANT
GOWN STRL REUS W/ TWL XL LVL3 (GOWN DISPOSABLE) ×1 IMPLANT
KIT MARKER MARGIN INK (KITS) IMPLANT
KIT TURNOVER KIT A (KITS) ×1 IMPLANT
MANIFOLD NEPTUNE II (INSTRUMENTS) ×1 IMPLANT
NDL HYPO 22X1.5 SAFETY MO (MISCELLANEOUS) ×1 IMPLANT
NEEDLE HYPO 22X1.5 SAFETY MO (MISCELLANEOUS) ×1 IMPLANT
PACK BASIN MINOR ARMC (MISCELLANEOUS) ×1 IMPLANT
SHEATH BREAST BIOPSY SKIN MKR (SHEATH) ×1 IMPLANT
SUT SILK 2 0 SH (SUTURE) IMPLANT
SUT VIC AB 3-0 SH 27X BRD (SUTURE) ×1 IMPLANT
SUTURE MNCRL 4-0 27XMF (SUTURE) ×1 IMPLANT
SYR 20ML LL LF (SYRINGE) ×1 IMPLANT
TRAP FLUID SMOKE EVACUATOR (MISCELLANEOUS) ×1 IMPLANT
TRAP NEPTUNE SPECIMEN COLLECT (MISCELLANEOUS) ×1 IMPLANT
WATER STERILE IRR 1000ML POUR (IV SOLUTION) ×1 IMPLANT
WATER STERILE IRR 500ML POUR (IV SOLUTION) ×1 IMPLANT

## 2023-09-28 NOTE — Op Note (Signed)
 Bracketed right breast lumpectomy, [employer] tag directed.  Pre-operative Diagnosis:  DCIS right breast.     Post-operative Diagnosis: Same  Surgeon: Honor Leghorn, M.D., [facility]  Anesthesia: Gen  Procedure: Right breast lumpectomy, bracketed [facility] tag directed.  Procedure Details  The patient was seen again in the Holding Room. The benefits, complications, treatment options, and expected outcomes were discussed with the patient. The risks of bleeding, infection, recurrence of symptoms, failure to resolve symptoms, hematoma, seroma, open wound, cosmetic deformity, and the need for further surgery were discussed.  The patient was taken to Operating Room, identified as Sherri Forbes and the procedure verified.  A Time Out was held and the above information confirmed.  Prior to the induction of general anesthesia, antibiotic prophylaxis was administered. VTE prophylaxis was in place. The patient was positioned in the supine position. Appropriate anesthesia was then administered and tolerated well.  The [facility] probe confirmed the site of shortest distance and highest cadence and that site is marked for incision.  The breast is secured with tape as needed to minimize motion during the procedure.  Attention was turned to the [facility] localization site where an incision was made.  A circumareolar incision is made inferior laterally.  Subareolar dissection is initiated sharply to provide an anterior margin.  Dissection using the Scout to perform a lumpectomy with adequate margins was performed. This was done with sharp dissection with scissors. Hemostasis is controlled where the risk of damaging the tag is diminished, and the cavity packed.  The specimen was taken to the back table and painted to demarcate the 6 surfaces of potential margin.  *** I returned to the cavity to remove the packing, and additional hemostasis was confirmed with electrocautery.   Once assuring that hemostasis was  adequate and checked multiple times being irrigated with water the wound was closed with interrupted 3-0 Vicryl followed by 4-0 subcuticular Monocryl sutures.   Dermabond is utilized to seal the incision.  Local anesthesia of 0.25% Marcaine  with epinephrine  is infiltrated into the cavity.   Findings: Faxitron imaging: ***  Estimated Blood Loss: Minimal         Drains: None         Specimens:  ***       Complications: ***         Condition: Stable    Honor Leghorn, M.D., [facility]  09/28/2023 ; 1:14 PM

## 2023-09-28 NOTE — Anesthesia Procedure Notes (Signed)
 Procedure Name: Intubation Date/Time: 09/28/2023 10:59 AM  Performed by: Lorrene Camelia LABOR, CRNAPre-anesthesia Checklist: Patient identified, Patient being monitored, Timeout performed, Emergency Drugs available and Suction available Patient Re-evaluated:Patient Re-evaluated prior to induction Oxygen Delivery Method: Circle system utilized Preoxygenation: Pre-oxygenation with 100% oxygen Induction Type: IV induction, Rapid sequence and Cricoid Pressure applied Laryngoscope Size: 3 and McGrath Grade View: Grade I Tube type: Oral Tube size: 7.0 mm Number of attempts: 1 Airway Equipment and Method: Stylet Placement Confirmation: ETT inserted through vocal cords under direct vision, positive ETCO2 and breath sounds checked- equal and bilateral Secured at: 21 cm Tube secured with: Tape Dental Injury: Teeth and Oropharynx as per pre-operative assessment

## 2023-09-28 NOTE — Anesthesia Postprocedure Evaluation (Signed)
 Anesthesia Post Note  Patient: Sherri Forbes  Procedure(s) Performed: BREAST LUMPECTOMY WITH RADIO FREQUENCY LOCALIZER (Right: Breast)  Patient location during evaluation: PACU Anesthesia Type: General Level of consciousness: awake and alert Pain management: pain level controlled Vital Signs Assessment: post-procedure vital signs reviewed and stable Respiratory status: spontaneous breathing, nonlabored ventilation, respiratory function stable and patient connected to nasal cannula oxygen Cardiovascular status: blood pressure returned to baseline and stable Postop Assessment: no apparent nausea or vomiting Anesthetic complications: no   No notable events documented.   Last Vitals:  Vitals:   09/28/23 1315 09/28/23 1329  BP: 117/66 138/80  Pulse:  99  Resp:  20  Temp: 36.6 C 36.6 C  SpO2:  95%    Last Pain:  Vitals:   09/28/23 1329  TempSrc: Temporal  PainSc: 2                  Lynwood KANDICE Clause

## 2023-09-28 NOTE — Anesthesia Preprocedure Evaluation (Signed)
 Anesthesia Evaluation  Patient identified by MRN, date of birth, ID band Patient awake    Reviewed: Allergy & Precautions, H&P , NPO status , Patient's Chart, lab work & pertinent test results, reviewed documented beta blocker date and time   Airway Mallampati: II  TM Distance: >3 FB Neck ROM: full    Dental  (+) Teeth Intact   Pulmonary neg pulmonary ROS, former smoker   Pulmonary exam normal        Cardiovascular Exercise Tolerance: Poor hypertension, On Medications negative cardio ROS Normal cardiovascular exam Rate:Normal     Neuro/Psych   Anxiety      Neuromuscular disease  negative psych ROS   GI/Hepatic negative GI ROS, Neg liver ROS,,,  Endo/Other  diabetes  Class 3 obesity  Renal/GU Renal disease  negative genitourinary   Musculoskeletal   Abdominal   Peds  Hematology negative hematology ROS (+)   Anesthesia Other Findings   Reproductive/Obstetrics negative OB ROS                              Anesthesia Physical Anesthesia Plan  ASA: 3  Anesthesia Plan: General LMA   Post-op Pain Management:    Induction:   PONV Risk Score and Plan: 4 or greater  Airway Management Planned:   Additional Equipment:   Intra-op Plan:   Post-operative Plan:   Informed Consent: I have reviewed the patients History and Physical, chart, labs and discussed the procedure including the risks, benefits and alternatives for the proposed anesthesia with the patient or authorized representative who has indicated his/her understanding and acceptance.       Plan Discussed with: CRNA  Anesthesia Plan Comments:         Anesthesia Quick Evaluation

## 2023-09-28 NOTE — Transfer of Care (Signed)
 Immediate Anesthesia Transfer of Care Note  Patient: Sherri Forbes  Procedure(s) Performed: BREAST LUMPECTOMY WITH RADIO FREQUENCY LOCALIZER (Right: Breast)  Patient Location: PACU  Anesthesia Type:General  Level of Consciousness: awake and patient cooperative  Airway & Oxygen Therapy: Patient Spontanous Breathing and Patient connected to face mask oxygen  Post-op Assessment: Report given to RN and Post -op Vital signs reviewed and stable  Post vital signs: Reviewed and stable  Last Vitals:  Vitals Value Taken Time  BP 123/72 09/28/23 12:32  Temp    Pulse 108 09/28/23 12:32  Resp 14 09/28/23 12:32  SpO2 97 % 09/28/23 12:32  Vitals shown include unfiled device data.  Last Pain:  Vitals:   09/28/23 1232  TempSrc: Tympanic  PainSc: 1          Complications: No notable events documented.

## 2023-09-28 NOTE — Interval H&P Note (Signed)
 History and Physical Interval Note:  09/28/2023 10:52 AM  Sherri Forbes  has presented today for surgery, with the diagnosis of Ductal carcinoma in situ of right breast.  The various methods of treatment have been discussed with the patient and family. After consideration of risks, benefits and other options for treatment, the patient has consented to  Procedure(s): BREAST LUMPECTOMY WITH RADIO FREQUENCY LOCALIZER (Right) as a surgical intervention.  The patient's history has been reviewed, patient examined, no change in status, stable for surgery.  I have reviewed the patient's chart and labs.  Questions were answered to the patient's satisfaction.   The right breast is marked.   Honor Leghorn

## 2023-09-29 ENCOUNTER — Encounter: Payer: Self-pay | Admitting: Surgery

## 2023-09-30 LAB — SURGICAL PATHOLOGY

## 2023-10-10 NOTE — Progress Notes (Unsigned)
 Sherri Forbes  HPI: Sherri Forbes is a 53 y.o. female had surgery on  Sept 10, 2025 , now s/p bracketed Scout directed right breast lumpectomy for DCIS.  Returns today with really no complaints about her surgery, but currently having right upper quadrant pain for about 4 to 5 days with associated nausea.  The pain is right upper quadrant and epigastric with radiation to the right scapula.  It has been exacerbated by eating and associated with loose stools and subjective sensation of chills without known fever.   Pathology report:  FINAL DIAGNOSIS        1. Breast, lumpectomy, Right :       - DUCTAL CARCINOMA IN SITU, NUCLEAR GRADE 2, SOLID, CRIBRIFORM AND       MICROPAPILLARY TYPES WITH COMEDONECROSIS, 3.3 CM (SEE SYNOPTIC REPORT).       - DCIS IS PRESENT LESS THAN 1 MM FROM THE ANTERIOR, LATERAL, AND SUPERIOR       MARGINS.       - TWO BIOPSY SITES AND CLIPS PRESENT (X AND VENUS).    Vital signs: BP (!) 183/122   Pulse (!) 142   Temp 98.8 F (37.1 C) (Oral)   Ht 5' 4 (1.626 m)   Wt 235 lb (106.6 kg)   LMP 07/07/2016   SpO2 97%   BMI 40.34 kg/m    Physical Exam: Constitutional: Appears to be in significant pain.  Conversant alert and oriented.  Husband at bedside. Chest: Clear to auscultation. Cor: Regular rate and rhythm, well-perfused. Right breast with expected ecchymosis, Dermabond intact.  Minimal dermal discoloration of the perimeter areola. Abdomen: Tender in the epigastrium and right upper quadrant, hesitant with deep inspiration during exam consistent with Murphy sign.  Otherwise soft and nontender. Skin: No evidence of jaundice, no scleral icterus.  Assessment/Plan: Sherri Forbes is a 53 y.o. female had surgery on  Sept 10, 2025 , now s/p bracketed Scout directed right breast lumpectomy for DCIS.   Now presenting with multiple days of right upper quadrant pain with radiation to the right scapula, consistent  with acute cholecystitis.  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   Ductal carcinoma in situ (DCIS) of right breast 09/07/2023   Family history of cancer 09/07/2023   Former smoker 11/26/2016   Nephrolithiasis 11/26/2016   Morbid obesity with BMI of 40.0-44.9, adult (HCC) 12/30/2015   Left ureteral stone 12/28/2015   Hypercholesterolemia 11/22/2014   Vitamin B 12 deficiency 04/22/2014   Carpal tunnel syndrome, bilateral 04/19/2014   Dyshidrotic hand dermatitis 04/19/2014   Paresthesia 04/19/2014   Dysuria 05/23/2013   Urinary symptom or sign 05/23/2013   Excess ear wax 11/15/2012   Hypertension 11/15/2012   Prediabetes 10/30/2012    - We will proceed with obtaining urgent right upper quadrant ultrasound, CMP CBC.  Anticipate likely adding on for surgery   This was discussed thoroughly.  Optimal plan is for robotic cholecystectomy utilizing ICG imaging. Risks and benefits have been discussed with the patient which include but are not limited to anesthesia, bleeding, infection, biliary ductal injury, resulting in leak or stenosis, other associated unanticipated injuries affiliated with laparoscopic surgery.   Reviewed that removing the gallbladder will only address the symptoms related to the gallbladder itself.  I believe there is the desire to proceed, accepting the risks with understanding.  Questions elicited and answered to satisfaction.    No guarantees ever expressed  or implied.   Honor Leghorn M.D., FACS Forbes, 9:48 AM

## 2023-10-11 ENCOUNTER — Other Ambulatory Visit: Payer: Self-pay

## 2023-10-11 ENCOUNTER — Ambulatory Visit (INDEPENDENT_AMBULATORY_CARE_PROVIDER_SITE_OTHER): Admitting: Surgery

## 2023-10-11 ENCOUNTER — Ambulatory Visit
Admission: RE | Admit: 2023-10-11 | Discharge: 2023-10-11 | Disposition: A | Discharge status: Home / Self Care | Source: Ambulatory Visit | Attending: Surgery | Admitting: Surgery

## 2023-10-11 ENCOUNTER — Encounter: Payer: Self-pay | Admitting: Surgery

## 2023-10-11 ENCOUNTER — Other Ambulatory Visit: Payer: Self-pay | Admitting: Surgery

## 2023-10-11 ENCOUNTER — Ambulatory Visit: Admit: 2023-10-11 | Admitting: Surgery

## 2023-10-11 ENCOUNTER — Other Ambulatory Visit
Admission: RE | Admit: 2023-10-11 | Discharge: 2023-10-11 | Disposition: A | Discharge status: Home / Self Care | Source: Home / Self Care | Attending: Surgery | Admitting: Surgery

## 2023-10-11 ENCOUNTER — Ambulatory Visit: Payer: Self-pay | Admitting: Surgery

## 2023-10-11 VITALS — BP 183/122 | HR 142 | Temp 98.8°F | Ht 64.0 in | Wt 235.0 lb

## 2023-10-11 DIAGNOSIS — K810 Acute cholecystitis: Secondary | ICD-10-CM

## 2023-10-11 DIAGNOSIS — R1011 Right upper quadrant pain: Secondary | ICD-10-CM | POA: Insufficient documentation

## 2023-10-11 LAB — CBC
HCT: 43.1 % (ref 36.0–46.0)
Hemoglobin: 14.8 g/dL (ref 12.0–15.0)
MCH: 27.8 pg (ref 26.0–34.0)
MCHC: 34.3 g/dL (ref 30.0–36.0)
MCV: 80.9 fL (ref 80.0–100.0)
Platelets: 263 K/uL (ref 150–400)
RBC: 5.33 MIL/uL — ABNORMAL HIGH (ref 3.87–5.11)
RDW: 14 % (ref 11.5–15.5)
WBC: 8.6 K/uL (ref 4.0–10.5)
nRBC: 0 % (ref 0.0–0.2)

## 2023-10-11 LAB — COMPREHENSIVE METABOLIC PANEL WITH GFR
ALT: 39 U/L (ref 0–44)
AST: 35 U/L (ref 15–41)
Albumin: 4.9 g/dL (ref 3.5–5.0)
Alkaline Phosphatase: 69 U/L (ref 38–126)
Anion gap: 11 (ref 5–15)
BUN: 15 mg/dL (ref 6–20)
CO2: 25 mmol/L (ref 22–32)
Calcium: 9.3 mg/dL (ref 8.9–10.3)
Chloride: 102 mmol/L (ref 98–111)
Creatinine, Ser: 0.75 mg/dL (ref 0.44–1.00)
GFR, Estimated: >60 mL/min (ref >60)
Glucose, Bld: 133 mg/dL — ABNORMAL HIGH (ref 70–99)
Potassium: 3.9 mmol/L (ref 3.5–5.1)
Sodium: 138 mmol/L (ref 135–145)
Total Bilirubin: 0.8 mg/dL (ref 0.0–1.2)
Total Protein: 8 g/dL (ref 6.5–8.1)

## 2023-10-11 SURGERY — CHOLECYSTECTOMY, ROBOT-ASSISTED, LAPAROSCOPIC
Anesthesia: General

## 2023-10-11 MED ORDER — PANTOPRAZOLE SODIUM 20 MG PO TBEC: 20.0000 mg | DELAYED_RELEASE_TABLET | Freq: Every day | ORAL | 0 refills | Status: DC

## 2023-10-11 NOTE — Patient Instructions (Addendum)
 Your Ultrasound is scheduled for [facility] at 10:45 am.   You have requested to have your gallbladder removed. This will be done at [facility] with Dr. Lane.  If you are on any injectable weight loss medication, you will need to stop taking your GLP-1 injectable (weight loss) medications 8 days before your surgery to avoid any complications with anesthesia.   You will most likely be out of work 1-2 weeks for this surgery.  If you have FMLA or disability paperwork that needs filled out you may drop this off at our office or this can be faxed to [phone].  You will return after your post-op appointment with a lifting restriction for approximately 4 more weeks.  You will be able to eat anything you would like to following surgery. But, start by eating a bland diet and advance this as tolerated. The Gallbladder diet is below, please go as closely by this diet as possible prior to surgery to avoid any further attacks.  Please see the (blue)pre-care form that you have been given today. Our surgery scheduler will call you to verify surgery date and to go over information.   If you have any questions, please call our office.  Laparoscopic Cholecystectomy Laparoscopic cholecystectomy is surgery to remove the gallbladder. The gallbladder is located in the upper right part of the abdomen, behind the liver. It is a storage sac for bile, which is produced in the liver. Bile aids in the digestion and absorption of fats. Cholecystectomy is often done for inflammation of the gallbladder (cholecystitis). This condition is usually caused by a buildup of gallstones (cholelithiasis) in the gallbladder. Gallstones can block the flow of bile, and that can result in inflammation and pain. In severe cases, emergency surgery may be required. If emergency surgery is not required, you will have time to prepare for the procedure. Laparoscopic surgery is an alternative to open surgery. Laparoscopic  surgery has a shorter recovery time. Your common bile duct may also need to be examined during the procedure. If stones are found in the common bile duct, they may be removed. LET [facility] CARE PROVIDER KNOW ABOUT: Any allergies you have. All medicines you are taking, including vitamins, herbs, eye drops, creams, and over-the-counter medicines. Previous problems you or members of your family have had with the use of anesthetics. Any blood disorders you have. Previous surgeries you have had.  Any medical conditions you have. RISKS AND COMPLICATIONS Generally, this is a safe procedure. However, problems may occur, including: Infection. Bleeding. Allergic reactions to medicines. Damage to other structures or organs. A stone remaining in the common bile duct. A bile leak from the cyst duct that is clipped when your gallbladder is removed. The need to convert to open surgery, which requires a larger incision in the abdomen. This may be necessary if your surgeon thinks that it is not safe to continue with a laparoscopic procedure. BEFORE THE PROCEDURE Ask your health care provider about: Changing or stopping your regular medicines. This is especially important if you are taking diabetes medicines or blood thinners. Taking medicines such as aspirin and ibuprofen. These medicines can thin your blood. Do not take these medicines before your procedure if your health care provider instructs you not to. Follow instructions from your health care provider about eating or drinking restrictions. Let your health care provider know if you develop a cold or an infection before surgery. Plan to have someone take you home after the procedure. Ask your health care  provider how your surgical site will be marked or identified. You may be given antibiotic medicine to help prevent infection. PROCEDURE To reduce your risk of infection: Your health care team will wash or sanitize their hands. Your skin will be  washed with soap. An IV tube may be inserted into one of your veins. You will be given a medicine to make you fall asleep (general anesthetic). A breathing tube will be placed in your mouth. The surgeon will make several small cuts (incisions) in your abdomen. A thin, lighted tube (laparoscope) that has a tiny camera on the end will be inserted through one of the small incisions. The camera on the laparoscope will send a picture to a TV screen (monitor) in the operating room. This will give the surgeon a good view inside your abdomen. A gas will be pumped into your abdomen. This will expand your abdomen to give the surgeon more room to perform the surgery. Other tools that are needed for the procedure will be inserted through the other incisions. The gallbladder will be removed through one of the incisions. After your gallbladder has been removed, the incisions will be closed with stitches (sutures), staples, or skin glue. Your incisions may be covered with a bandage (dressing). The procedure may vary among health care providers and hospitals. AFTER THE PROCEDURE Your blood pressure, heart rate, breathing rate, and blood oxygen level will be monitored often until the medicines you were given have worn off. You will be given medicines as needed to control your pain.   This information is not intended to replace advice given to you by your health care provider. Make sure you discuss any questions you have with your health care provider.   Document Released: 01/04/2005 Document Revised: 09/25/2014 Document Reviewed: 08/16/2012 Elsevier Interactive Patient Education 2016 [employer]   Low-Fat Diet for Gallbladder Conditions A low-fat diet can be helpful if you have pancreatitis or a gallbladder condition. With these conditions, your pancreas and gallbladder have trouble digesting fats. A healthy eating plan with less fat will help rest your pancreas and gallbladder and reduce your  symptoms. WHAT DO I NEED TO KNOW ABOUT THIS DIET? Eat a low-fat diet. Reduce your fat intake to less than 20-30% of your total daily calories. This is less than 50-60 g of fat per day. Remember that you need some fat in your diet. Ask your dietician what your daily goal should be. Choose nonfat and low-fat healthy foods. Look for the words nonfat, low fat, or fat free. As a guide, look on the label and choose foods with less than 3 g of fat per serving. Eat only one serving. Avoid alcohol. Do not smoke. If you need help quitting, talk with your health care provider. Eat small frequent meals instead of three large heavy meals. WHAT FOODS CAN I EAT? Grains Include healthy grains and starches such as potatoes, wheat bread, fiber-rich cereal, and brown rice. Choose whole grain options whenever possible. In adults, whole grains should account for 45-65% of your daily calories.  Fruits and Vegetables Eat plenty of fruits and vegetables. Fresh fruits and vegetables add fiber to your diet. Meats and Other Protein Sources Eat lean meat such as chicken and pork. Trim any fat off of meat before cooking it. Eggs, fish, and beans are other sources of protein. In adults, these foods should account for 10-35% of your daily calories. Dairy Choose low-fat milk and dairy options. Dairy includes fat and protein, as well as calcium.  Fats and Oils Limit high-fat foods such as fried foods, sweets, baked goods, sugary drinks.  Other Creamy sauces and condiments, such as mayonnaise, can add extra fat. Think about whether or not you need to use them, or use smaller amounts or low fat options. WHAT FOODS ARE NOT RECOMMENDED? High fat foods, such as: [employer]. Ice cream. [place] toast. Sweet rolls. Pizza. Cheese bread. Foods covered with batter, butter, creamy sauces, or cheese. Fried foods. Sugary drinks and desserts. Foods that cause gas or bloating   This information is not intended to replace  advice given to you by your health care provider. Make sure you discuss any questions you have with your health care provider.   Document Released: 01/09/2013 Document Reviewed: 01/09/2013 Elsevier Interactive Patient Education [employer]

## 2023-10-11 NOTE — Progress Notes (Signed)
 Reported to her labs and ultrasound were reassuring.  Will proceed with obtaining HIDA scan, we will cancel her surgery for now.  Advised trial of NSAID, 600 of ibuprofen with food 3-4 times daily on schedule.  Sent in prescription for Protonix .    Will gladly see her should she get worse, or will reach out to her as soon as the HIDA scan with a EF has returned.

## 2023-10-17 ENCOUNTER — Ambulatory Visit
Admission: RE | Admit: 2023-10-17 | Discharge: 2023-10-17 | Disposition: A | Discharge status: Home / Self Care | Source: Ambulatory Visit | Attending: Surgery | Admitting: Surgery

## 2023-10-17 DIAGNOSIS — R1011 Right upper quadrant pain: Secondary | ICD-10-CM | POA: Diagnosis present

## 2023-10-17 MED ORDER — IOHEXOL 300 MG/ML  SOLN
100.0000 mL | Freq: Once | INTRAMUSCULAR | Status: AC | PRN
Start: 2023-10-17 — End: 2023-10-17
  Administered 2023-10-17: 100 mL via INTRAVENOUS

## 2023-10-19 ENCOUNTER — Ambulatory Visit: Admitting: Occupational Therapy

## 2023-10-19 ENCOUNTER — Telehealth: Payer: Self-pay | Admitting: Surgery

## 2023-10-19 ENCOUNTER — Inpatient Hospital Stay: Admitting: Oncology

## 2023-10-19 ENCOUNTER — Ambulatory Visit
Admission: RE | Admit: 2023-10-19 | Discharge: 2023-10-19 | Disposition: A | Discharge status: Home / Self Care | Source: Ambulatory Visit | Attending: Radiation Oncology | Admitting: Radiation Oncology

## 2023-10-19 ENCOUNTER — Encounter: Payer: Self-pay | Admitting: Oncology

## 2023-10-19 VITALS — BP 127/52 | HR 95 | Temp 98.8°F | Resp 16 | Wt 237.0 lb

## 2023-10-19 DIAGNOSIS — Z87891 Personal history of nicotine dependence: Secondary | ICD-10-CM | POA: Insufficient documentation

## 2023-10-19 DIAGNOSIS — R160 Hepatomegaly, not elsewhere classified: Secondary | ICD-10-CM | POA: Insufficient documentation

## 2023-10-19 DIAGNOSIS — D0511 Intraductal carcinoma in situ of right breast: Secondary | ICD-10-CM | POA: Diagnosis present

## 2023-10-19 DIAGNOSIS — Z803 Family history of malignant neoplasm of breast: Secondary | ICD-10-CM | POA: Insufficient documentation

## 2023-10-19 DIAGNOSIS — Z800 Family history of malignant neoplasm of digestive organs: Secondary | ICD-10-CM | POA: Insufficient documentation

## 2023-10-19 DIAGNOSIS — Z170 Estrogen receptor positive status [ER+]: Secondary | ICD-10-CM | POA: Insufficient documentation

## 2023-10-19 DIAGNOSIS — Z801 Family history of malignant neoplasm of trachea, bronchus and lung: Secondary | ICD-10-CM | POA: Insufficient documentation

## 2023-10-19 DIAGNOSIS — Z510 Encounter for antineoplastic radiation therapy: Secondary | ICD-10-CM | POA: Insufficient documentation

## 2023-10-19 NOTE — Assessment & Plan Note (Signed)
 CT and ultrasound results reviewed and discussed with patient. Hepatomegaly is likely due to fatty liver disease.  Recommend regular ibuprofen. CT showed Enlarged periportal lymph nodes are presumably reactive in etiology.  Follow-up in the future.

## 2023-10-19 NOTE — Progress Notes (Addendum)
 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 Recommend adjuvant radiation followed by endocrine therapy with AI for 5 years. Obtain baseline DEXA.  Hepatomegaly CT and ultrasound results reviewed and discussed with patient. Hepatomegaly is likely due to fatty liver disease.  Recommend regular ibuprofen. CT showed Enlarged periportal lymph nodes are presumably reactive in etiology.  Follow-up in the future.   No orders of the defined types were placed in this encounter.  Follow-up to be determined. All questions were answered. The patient knows to call the clinic with any problems, questions or concerns.  Call Babara, MD, PhD [facility] Hematology Oncology 10/19/2023   HISTORY OF PRESENTING ILLNESS:   Sherri Forbes is a  53 y.o.  female with PMH listed below was seen in consultation at the request of  Zachary Idelia LABOR, MD  for evaluation of right breast DCIS.  Oncology History  Ductal carcinoma in situ (DCIS) of right breast  08/12/2023 Mammogram   Patient had felt a left breast nodule and underwent further workup. Bilateral diagnostic mammogram  1. There is a benign appearing intradermal mass at the site of palpable concern in the LEFT breast most consistent with an epidermal inclusion cyst. Recommend clinical management of this mass. 2. There is approximately 5 cm of  indeterminate calcifications in the RIGHT breast with a dominant 17 mm group in the RIGHT lower breast at anterior depth. There is a favored sonographic correlate for these anterior calcifications at 6 o'clock in the retroareolar breast. Recommend ultrasound-guided biopsy of these calcifications at 6 o'clock followed by stereotactic guided biopsy of a second representative area of calcifications at middle depth for definitive characterization. 3. No mammographic evidence of malignancy in the LEFT breast.   09/07/2023 Initial Diagnosis   Ductal carcinoma in situ (DCIS) of right breast  Patient underwent right breast ultrasound-guided biopsy as well as stereotactic biopsy.  Pathology showed  1. 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 PATRICK REVIEWED THE CASE AND CONCURS WITH THE INTERPRETATION.  A BREAST      PROGNOSTIC PROFILE ER 99% positive, PR 40% positive.       2. 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      - NECROSIS: SINGLE CELL NECROSIS      - CALCIFICATIONS: PRESENT      - DCIS LENGTH: MULTIFOCAL FOCI EACH APPROXIMATELY 2 MM IN GREATEST  LINEAR      DIMENSION PRESENT ON PARTS 2A, 2B, 2C AND 2D   Menarche at age of 77 First live birth at age of 63 OCP use: > 5 years History of hysterectomy: no Menopausal status: Postmenopausal History of HRT use: No Number of previous breast biopsies: No    09/07/2023 Cancer Staging   Staging form: Breast, AJCC 8th Edition - Clinical stage from 09/07/2023: Stage 0 (cTis (DCIS), cN0, cM0) - Signed by Babara Call, MD on 09/07/2023 Stage prefix: Initial diagnosis   09/28/2023 Surgery   Patient is  status post right lumpectomy. Pathology showed 1. Breast, lumpectomy, Right :       - DUCTAL CARCINOMA IN SITU, NUCLEAR GRADE 2, SOLID, CRIBRIFORM AND MICROPAPILLARY TYPES WITH COMEDONECROSIS, 3.3 CM (SEE SYNOPTIC REPORT).       - DCIS IS PRESENT LESS THAN 1 MM FROM THE ANTERIOR, LATERAL, AND SUPERIOR MARGINS.       - TWO BIOPSY SITES AND CLIPS PRESENT (X AND VENUS).   TUMOR Histologic Type: Ductal carcinoma in situ Size (Extent) of DCIS: Estimated size (extent) of DCIS is at least 33 mm Nuclear Grade: Grade 2 (intermediate) Necrosis: Present, central (expansive comedo necrosis) MARGINS Margin Status: All margins negative for DCIS Distance from DCIS to closest margin: Less than 1 mm Closest margins to DCIS: Anterior, superior, lateral REGIONAL LYMPH NODES Regional Lymph node Status: Not applicable (no regional lymph nodes submitted or found) DISTANT METASTASIS Distant Site(s) Involved, if applicable (select all that apply): Not applicable PATHOLOGIC STAGE CLASSIFICATION (pTNM, AJCC 8th Edition)  pTis (ductal carcinoma in situ) Regional Lymph Nodes Modifier: Not applicable pN not assigned (no nodes submitted or found) pM - Not applicable SPECIAL STUDIES Breast Biomarker Testing Performed on Previous Biopsy: [id number] - Estrogen Receptor (ER): Positive, 99%     Patient has had right breast lumpectomy.  She presented to discuss adjuvant plan.  Patient reports recent history of right upper quadrant pain for about 4 to 5 days with associated nausea.  Pain is at right upper quadrant and epigastric area.  Exacerbated by eating associate with loose stools.  No fever.  Patient was evaluated by surgery Dr. Lane and ultrasound of the abdomen and a CT scan was obtained.  Patient's pain improved after a course of ibuprofen.  Today patient denies any pain, fever, chills nausea vomiting.  MEDICAL HISTORY:  Past Medical History:  Diagnosis Date   Acute appendicitis with rupture  11/23/2016   Anxiety    Carpal tunnel syndrome, bilateral 04/19/2014   Chronic kidney disease    DM (diabetes mellitus), type 2 (HCC)    Ductal carcinoma in situ (DCIS) of right breast    Dyshidrotic hand dermatitis 04/19/2014   Excess ear wax 11/15/2012   History of kidney stones    Hyperlipidemia    Hypertension    Obesity    Paresthesia 04/19/2014   Renal mass    Vitamin B 12 deficiency 04/22/2014    SURGICAL HISTORY: Past Surgical History:  Procedure Laterality Date   APPENDECTOMY  11/23/2016   Dr. Nicholaus   BREAST BIOPSY Right 08/25/2023   US  RT BREAST BX W LOC DEV 1ST LESION IMG BX SPEC US  GUIDE 08/25/2023 [facility]   BREAST BIOPSY Right 08/25/2023   MM RT BREAST BX W LOC DEV 1ST LESION IMAGE BX SPEC STEREO GUIDE 08/25/2023 [facility]   BREAST BIOPSY Right 09/21/2023   MM RT BREAST SAVI/RF TAG 1ST LESION MAMMO GUIDE 09/21/2023 [facility]   BREAST BIOPSY Right 09/21/2023  US  RT BREAST SAVI/RF TAG 1ST LESION US  GUIDE 09/21/2023 [facility]   BREAST LUMPECTOMY WITH RADIO FREQUENCY LOCALIZER Right 09/28/2023   Procedure: BREAST LUMPECTOMY WITH RADIO FREQUENCY LOCALIZER;  Surgeon: Lane Shope, MD;  Location: [facility];  Service: General;  Laterality: Right;   COLONOSCOPY WITH PROPOFOL  N/A 03/09/2021   Procedure: COLONOSCOPY WITH PROPOFOL ;  Surgeon: Onita Elspeth Sharper, DO;  Location: [facility]OSCOPY;  Service: Gastroenterology;  Laterality: N/A;   KIDNEY STONE SURGERY     LAPAROSCOPIC APPENDECTOMY N/A 11/23/2016   Procedure: APPENDECTOMY LAPAROSCOPIC;  Surgeon: Nicholaus Selinda Birmingham, MD;  Location: [facility];  Service: General;  Laterality: N/A;    SOCIAL HISTORY: Social History   Socioeconomic History   Marital status: Married    Spouse name: Not on file   Number of children: Not on file   Years of education: Not on file   Highest education level: Not on file  Occupational History   Not on file  Tobacco Use   Smoking status: Former    Current packs/day: 0.00     Types: Cigarettes    Quit date: 06/05/2008    Years since quitting: 15.3   Smokeless tobacco: Never  Vaping Use   Vaping status: Never Used  Substance and Sexual Activity   Alcohol use: Yes    Comment: Social   Drug use: No   Sexual activity: Yes    Birth control/protection: None, Post-menopausal  Other Topics Concern   Not on file  Social History Narrative   Not on file   Social Drivers of Health   Financial Resource Strain: Low Risk  (09/07/2023)   Overall Financial Resource Strain (CARDIA)    Difficulty of Paying Living Expenses: Not very hard  Food Insecurity: No Food Insecurity (09/07/2023)   Hunger Vital Sign    Worried About Running Out of Food in the Last Year: Never true    Ran Out of Food in the Last Year: Never true  Transportation Needs: No Transportation Needs (09/07/2023)   PRAPARE - Administrator, Civil Service (Medical): No    Lack of Transportation (Non-Medical): No  Physical Activity: Not on file  Stress: No Stress Concern Present (09/07/2023)   [employer] of Occupational Health - Occupational Stress Questionnaire    Feeling of Stress: Only a little  Social Connections: Not on file  Intimate Partner Violence: Not At Risk (09/07/2023)   Humiliation, Afraid, Rape, and Kick questionnaire    Fear of Current or Ex-Partner: No    Emotionally Abused: No    Physically Abused: No    Sexually Abused: No    FAMILY HISTORY: Family History  Problem Relation Age of Onset   Hypertension Mother    Lung cancer Mother    COPD Mother    Colon cancer Father    Breast cancer Maternal Grandmother 22   Emphysema Maternal Grandmother    Melanoma Maternal Grandmother     ALLERGIES:  is allergic to penicillins and sulfa antibiotics.  MEDICATIONS:  Current Outpatient Medications  Medication Sig Dispense Refill   busPIRone (BUSPAR) 7.5 MG tablet Take 7.5 mg by mouth 2 (two) times daily.     hydrALAZINE (APRESOLINE) 50 MG tablet Take 50 mg by mouth 2  (two) times daily.     HYDROcodone -acetaminophen  (NORCO/VICODIN) 5-325 MG tablet Take 1 tablet by mouth every 6 (six) hours as needed for moderate pain (pain score 4-6). 15 tablet 0   lisinopril (ZESTRIL) 40 MG tablet Take 40 mg by mouth at bedtime.  methenamine (HIPREX) 1 g tablet Take 1 g by mouth 2 (two) times daily with a meal.     OZEMPIC, 0.25 OR 0.5 MG/DOSE, 2 MG/3ML SOPN Inject 0.25 mg into the skin once a week. Saturdays     pantoprazole  (PROTONIX ) 20 MG tablet TAKE 1 TABLET(20 MG) BY MOUTH DAILY 90 tablet 1   PARoxetine (PAXIL) 20 MG tablet Take 20 mg by mouth at bedtime.     simvastatin (ZOCOR) 40 MG tablet Take 40 mg by mouth at bedtime.     No current facility-administered medications for this visit.    Review of Systems  Constitutional:  Negative for appetite change, chills, fatigue and fever.  HENT:   Negative for hearing loss and voice change.   Eyes:  Negative for eye problems.  Respiratory:  Negative for chest tightness and cough.   Cardiovascular:  Negative for chest pain.  Gastrointestinal:  Negative for abdominal distention, abdominal pain and blood in stool.  Endocrine: Negative for hot flashes.  Genitourinary:  Negative for difficulty urinating and frequency.   Musculoskeletal:  Negative for arthralgias.  Skin:  Negative for itching and rash.  Neurological:  Negative for extremity weakness.  Hematological:  Negative for adenopathy.  Psychiatric/Behavioral:  Negative for confusion.    PHYSICAL EXAMINATION:  Vitals:   10/19/23 1346  BP: (!) 127/52  Pulse: 95  Resp: 16  Temp: 98.8 F (37.1 C)  SpO2: 97%   Filed Weights   10/19/23 1346  Weight: 237 lb (107.5 kg)    Physical Exam Constitutional:      General: She is not in acute distress. HENT:     Head: Normocephalic and atraumatic.  Eyes:     General: No scleral icterus. Cardiovascular:     Rate and Rhythm: Normal rate.  Pulmonary:     Effort: Pulmonary effort is normal. No respiratory  distress.     Breath sounds: No wheezing.  Abdominal:     General: There is no distension.     Palpations: Abdomen is soft.  Musculoskeletal:        General: No deformity. Normal range of motion.     Cervical back: Normal range of motion and neck supple.  Skin:    Findings: No rash.  Neurological:     Mental Status: She is alert and oriented to person, place, and time. Mental status is at baseline.     Cranial Nerves: No cranial nerve deficit.  Psychiatric:        Mood and Affect: Mood normal.     LABORATORY DATA:  I have reviewed the data as listed    Latest Ref Rng & Units 10/11/2023   11:22 AM 09/07/2023   10:26 AM 11/24/2016    4:22 AM  CBC  WBC 4.0 - 10.5 K/uL 8.6  8.6  13.1   Hemoglobin 12.0 - 15.0 g/dL 85.1  85.4  87.1   Hematocrit 36.0 - 46.0 % 43.1  43.6  38.1   Platelets 150 - 400 K/uL 263  277  195       Latest Ref Rng & Units 10/11/2023   11:22 AM 09/07/2023   10:26 AM 11/23/2016    1:07 PM  CMP  Glucose 70 - 99 mg/dL 866  882  882   BUN 6 - 20 mg/dL 15  15  19    Creatinine 0.44 - 1.00 mg/dL 9.24  9.32  9.28   Sodium 135 - 145 mmol/L 138  135  137   Potassium 3.5 - 5.1  mmol/L 3.9  4.0  3.7   Chloride 98 - 111 mmol/L 102  101  101   CO2 22 - 32 mmol/L 25  23  24    Calcium 8.9 - 10.3 mg/dL 9.3  9.7  9.3   Total Protein 6.5 - 8.1 g/dL 8.0  8.0  8.0   Total Bilirubin 0.0 - 1.2 mg/dL 0.8  0.9  1.1   Alkaline Phos 38 - 126 U/L 69  70  98   AST 15 - 41 U/L 35  31  21   ALT 0 - 44 U/L 39  35  25       RADIOGRAPHIC STUDIES: I have personally reviewed the radiological images as listed and agreed with the findings in the report. CT ABDOMEN PELVIS W CONTRAST Result Date: 10/19/2023 CLINICAL DATA:  Right upper quadrant pain. EXAM: CT ABDOMEN AND PELVIS WITH CONTRAST TECHNIQUE: Multidetector CT imaging of the abdomen and pelvis was performed using the standard protocol following bolus administration of intravenous contrast. RADIATION DOSE REDUCTION: This exam was  performed according to the departmental dose-optimization program which includes automated exposure control, adjustment of the mA and/or kV according to patient size and/or use of iterative reconstruction technique. CONTRAST:  OMNIPAQUE  IOHEXOL  300 MG/ML  SOLN COMPARISON:  Ultrasound abdomen 10/11/2023 and CT abdomen pelvis 11/23/2016. FINDINGS: Lower chest: Mild scarring in the lingula and left lower lobe. Heart is at the upper limits of normal in size. Small amount of pericardial fluid may be physiologic. No pleural effusion. Distal esophagus is grossly unremarkable. Hepatobiliary: Liver is decreased in attenuation diffusely and is enlarged, 19.3 cm. Liver and gallbladder are otherwise unremarkable. No biliary ductal dilatation. Pancreas: Negative. Spleen: Negative. Adrenals/Urinary Tract: Adrenal glands and right kidney are unremarkable. Calcification in the upper pole left kidney. Low-attenuation lesions in the left kidney. No specific follow-up necessary. Ureters are decompressed. Trace air in the bladder, presumably iatrogenic in etiology. The bladder is low in volume. Stomach/Bowel: Stomach, small bowel and appendix are unremarkable. Appendectomy. Vascular/Lymphatic: Retroaortic left renal vein. Atherosclerotic calcification of the aorta. Periportal lymph nodes measure up to 1.4 cm. Reproductive: No adnexal mass. Other: No free fluid.  Mesenteries and peritoneum are unremarkable. Musculoskeletal: Degenerative changes in the spine. IMPRESSION: 1. No acute findings. 2. Steatotic enlarged liver. Enlarged periportal lymph nodes are presumably reactive in etiology. 3. Trace air in the bladder is presumably iatrogenic in etiology. Please correlate clinically. 4.  Aortic atherosclerosis (ICD10-I70.0). Electronically Signed   By: Newell Eke M.D.   On: 10/19/2023 08:52   US  ABDOMEN LIMITED RUQ (LIVER/GB) Result Date: 10/11/2023 CLINICAL DATA:  Right upper quadrant abdominal pain. EXAM: ULTRASOUND ABDOMEN  LIMITED RIGHT UPPER QUADRANT COMPARISON:  None Available. FINDINGS: Gallbladder: No gallstones, wall thickening, polyps or pericholecystic fluid visualized. Patient reportedly tender over the gallbladder at the time of sonographic imaging per sonographer but the gallbladder appears completely normal by ultrasound. Common bile duct: Diameter: 4 mm Liver: Very echogenic and heterogeneous liver parenchyma is consistent with underlying steatosis. The liver is also enlarged. No focal lesions identified by ultrasound. No intrahepatic biliary ductal dilatation. Portal vein is patent on color Doppler imaging with normal direction of blood flow towards the liver. Other: No ascites visualized in the right upper quadrant. IMPRESSION: 1. Normal appearance of the gallbladder by ultrasound. Reported sonographic tenderness overlying the gallbladder during the exam which is nonspecific given the normal sonographic appearance of the gallbladder and bile ducts by ultrasound. 2. Hepatomegaly and hepatic steatosis. Electronically Signed   By:  Marcey Moan M.D.   On: 10/11/2023 11:56   MM Breast Surgical Specimen Result Date: 09/28/2023 CLINICAL DATA:  Status post [facility] localized RIGHT breast lumpectomy. EXAM: SPECIMEN RADIOGRAPH OF THE RIGHT BREAST COMPARISON:  Previous exam(s). FINDINGS: Status post excision of the RIGHT breast. The 2 Savi Scout reflectors, X shaped and venus shaped clip are present within the specimen. IMPRESSION: Specimen radiograph of the RIGHT breast. Electronically Signed   By: Corean Salter M.D.   On: 09/28/2023 14:26   US  RT BREAST SAVY/RF TAG 1ST LESION US  GUIDE Result Date: 09/21/2023 CLINICAL DATA:  RIGHT breast DCIS spanning approximately 5 cm, with anterior extent biopsied with Venus clip and posterior extent biopsied with X clip EXAM: NEEDLE LOCALIZATION OF THE RIGHT BREAST WITH ULTRASOUND GUIDANCE; NEEDLE LOCALIZATION OF THE RIGHT BREAST WITH MAMMOGRAPHIC GUIDANCE COMPARISON:  Previous  exam(s). 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 I met with the patient and we discussed the procedure of needle localization including benefits and alternatives. We discussed the high likelihood of a successful procedure. We discussed the risks of the procedure, including infection, bleeding, tissue injury, and further surgery. Informed, written consent was given. The usual time-out protocol was performed immediately prior to the procedure. SITE 1: Anterior extent, Venus clip, ultrasound-guided Using ultrasound guidance, sterile technique, 1% lidocaine  and a 10 cm SAVI SCOUT needle, the anterior extent of the RIGHT breast calcifications was localized using a inferior approach. SITE 2: Posterior extent, X clip, mammogram guided Using mammographic guidance, sterile technique, 1% lidocaine  and a 10 cm SAVI SCOUT needle, the posterior extent of the RIGHT breast calcifications was localized using a LATERAL approach. Residual calcifications largely extend medially from the biopsy sites. The images were marked for Dr. Lane. IMPRESSION: Radar reflector localization of the RIGHT breast. Residual calcifications are present at the biopsy sites and extending medially. No apparent complications. Electronically Signed   By: Norleen Croak M.D.   On: 09/21/2023 15:25   MM RT BREAST SAVI/RF TAG 1ST LESION MAMMO GUIDE Result Date: 09/21/2023 CLINICAL DATA:  RIGHT breast DCIS spanning approximately 5 cm, with anterior extent biopsied with Venus clip and posterior extent biopsied with X clip EXAM: NEEDLE LOCALIZATION OF THE RIGHT BREAST WITH ULTRASOUND GUIDANCE; NEEDLE LOCALIZATION OF THE RIGHT BREAST WITH MAMMOGRAPHIC GUIDANCE COMPARISON:  Previous exam(s). 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 I met with the patient and we discussed the procedure of needle  localization including benefits and alternatives. We discussed the high likelihood of a successful procedure. We discussed the risks of the procedure, including infection, bleeding, tissue injury, and further surgery. Informed, written consent was given. The usual time-out protocol was performed immediately prior to the procedure. SITE 1: Anterior extent, Venus clip, ultrasound-guided Using ultrasound guidance, sterile technique, 1% lidocaine  and a 10 cm SAVI SCOUT needle, the anterior extent of the RIGHT breast calcifications was localized using a inferior approach. SITE 2: Posterior extent, X clip, mammogram guided Using mammographic guidance, sterile technique, 1% lidocaine  and a 10 cm SAVI SCOUT needle, the posterior extent of the RIGHT breast calcifications was localized using a LATERAL approach. Residual calcifications largely extend medially from the biopsy sites. The images were marked for Dr. Lane. IMPRESSION: Radar reflector localization of the RIGHT breast. Residual calcifications are present at the biopsy sites and extending medially. No apparent complications. Electronically Signed   By: Norleen Croak HERO.D.  On: 09/21/2023 15:25   US  AXILLA RIGHT Result Date: 09/15/2023 CLINICAL DATA:  Patient is status post 2 site biopsy which demonstrated ductal carcinoma in-situ. Evaluation of the RIGHT axilla per treatment team request. EXAM: ULTRASOUND OF THE RIGHT AXILLA COMPARISON:  Previous exams. FINDINGS: Targeted ultrasound was performed of the RIGHT axilla. No suspicious axillary lymph nodes are visualized. Lymph nodes demonstrate preserved echogenic hila and thin smooth cortices. IMPRESSION: No suspicious RIGHT axillary adenopathy. RECOMMENDATION: Recommend continued clinical and surgical management of biopsy proven RIGHT breast ductal carcinoma in-situ. I have discussed the findings and recommendations with the patient. If applicable, a reminder letter will be sent to the patient regarding the next  appointment. BI-RADS CATEGORY  2: Benign. Electronically Signed   By: Corean Salter M.D.   On: 09/15/2023 10:36   US  RT BREAST BX W LOC DEV 1ST LESION IMG BX SPEC US  GUIDE Addendum Date: 08/26/2023 ADDENDUM REPORT: 08/26/2023 15:06 ADDENDUM: PATHOLOGY revealed: Site 1. 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 Pathology results are CONCORDANT with imaging findings, per Norleen Croak M.D. PATHOLOGY revealed: Site 2. 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 Pathology results and recommendations below were discussed with patient by telephone on 08/26/2023 by Rock Hover RN. Patient reported biopsy site within normal limits with slight tenderness at the site. Post biopsy care instructions were reviewed, questions were answered and my direct phone number was provided to patient. Patient was instructed to call [facility] if any concerns or questions arise related to the biopsy. RECOMMENDATIONS: 1. Surgical and oncological consultation. Request for surgical and oncological consultation relayed to Shasta Ada RN at [facility] by Rock Hover RN on 08/26/2023. Given extent of calcifications, if breast conservation is pursued a bracketed localization will be necessary. Pathology results reported by Rock Hover RN on 08/26/2023. Electronically Signed   By: Norleen Croak M.D.   On: 08/26/2023 15:06   Result Date: 08/26/2023 CLINICAL DATA:  RIGHT breast inferior calcifications were ultrasound and  stereotactic biopsy EXAM: ULTRASOUND GUIDED RIGHT BREAST CORE NEEDLE BIOPSY and STEREOTACTIC GUIDED RIGHT BREAST CORE NEEDLE BIOPSY COMPARISON:  Previous exam(s). PROCEDURE: Site 1: RIGHT breast 6 o'clock RETROAREOLAR calcifications, ultrasound-guided, anterior depth, Venus clip I met with the patient and we discussed the procedure of ultrasound-guided biopsy, including benefits and alternatives. We discussed the high likelihood of a successful procedure. We discussed the risks of the procedure, including infection, bleeding, tissue injury, clip migration, and inadequate sampling. Informed written consent was given. The usual time-out protocol was performed immediately prior to the procedure. Lesion quadrant: LOWER OUTER Using sterile technique and 1% lidocaine  and 1% lidocaine  with epinephrine  as local anesthetic, under direct ultrasound visualization, a 14 gauge spring-loaded device was used to perform biopsy of the RIGHT breast 6 o'clock calcifications RETROAREOLAR position using a inferior approach. At the conclusion of the procedure Venus shaped tissue marker clip was deployed into the biopsy cavity. Site 2: RIGHT breast inferior calcifications, stereotactic guided, middle depth, X clip The patient and I discussed the procedure of stereotactic-guided  biopsy including benefits and alternatives. We discussed the high likelihood of a successful procedure. We discussed the risks of the procedure including infection, bleeding, tissue injury, clip migration, and inadequate sampling. Informed written consent was given. The usual time out protocol was performed immediately prior to the procedure. Using sterile technique and 1% lidocaine  and 1% lidocaine  with epinephrine  as local anesthetic, under stereotactic guidance, a 9 gauge vacuum assisted device was used to perform core needle biopsy of calcifications in the lower outer quadrant of the RIGHT breast using a LATERAL approach. Specimen radiograph was performed  showing representative calcifications. Specimens with calcifications are identified for pathology. Lesion quadrant: Lower outer At the conclusion of the procedure, X shaped tissue marker clip was deployed into the biopsy cavity. Follow-up 2-view mammogram was performed and dictated separately. IMPRESSION: Ultrasound guided biopsy and stereotactic guided biopsy of calcifications in the inferior RIGHT breast. No apparent complications. Electronically Signed: By: Norleen Croak M.D. On: 08/25/2023 09:00   MM RT BREAST BX W LOC DEV 1ST LESION IMAGE BX SPEC STEREO GUIDE Addendum Date: 08/26/2023 ADDENDUM REPORT: 08/26/2023 15:06 ADDENDUM: PATHOLOGY revealed: Site 1. 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 Pathology results are CONCORDANT with imaging findings, per Norleen Croak M.D. PATHOLOGY revealed: Site 2. 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 Pathology results and recommendations below were discussed with patient by telephone on 08/26/2023 by Rock Hover RN. Patient reported biopsy site within normal limits with slight tenderness at the site. Post biopsy care instructions were reviewed, questions were answered and my direct phone number was provided to patient. Patient was instructed to call [facility] if any concerns or questions arise related to the biopsy. RECOMMENDATIONS: 1. Surgical and oncological consultation. Request for surgical and oncological consultation relayed to Shasta Ada RN at [facility] by Rock Hover RN on 08/26/2023. Given extent of calcifications, if breast conservation is pursued a bracketed localization will be necessary. Pathology results reported by Rock Hover RN on 08/26/2023. Electronically Signed   By: Norleen Croak M.D.   On: 08/26/2023 15:06   Result Date: 08/26/2023 CLINICAL DATA:  RIGHT breast inferior calcifications were ultrasound and stereotactic biopsy EXAM: ULTRASOUND GUIDED RIGHT BREAST CORE NEEDLE BIOPSY and STEREOTACTIC GUIDED RIGHT BREAST CORE NEEDLE BIOPSY COMPARISON:  Previous exam(s). PROCEDURE: Site 1: RIGHT breast 6 o'clock RETROAREOLAR calcifications, ultrasound-guided, anterior depth, Venus clip I met with the patient and we discussed the procedure of ultrasound-guided biopsy, including benefits and alternatives. We discussed the high likelihood of a successful procedure. We discussed the risks of the procedure, including infection, bleeding, tissue injury, clip migration, and inadequate sampling. Informed written consent was given. The usual time-out protocol was performed immediately prior to the procedure. Lesion quadrant: LOWER OUTER Using sterile technique and 1% lidocaine  and 1% lidocaine  with epinephrine  as local anesthetic, under direct ultrasound visualization, a 14 gauge spring-loaded device was used to perform biopsy of the RIGHT breast 6 o'clock calcifications RETROAREOLAR position using a inferior approach. At the conclusion of the procedure Venus shaped tissue marker  clip was deployed into the biopsy cavity. Site 2: RIGHT breast inferior calcifications, stereotactic guided, middle depth, X clip The patient and I discussed the procedure of stereotactic-guided biopsy including benefits and alternatives. We discussed the high likelihood of a successful procedure. We discussed the risks of the procedure including infection, bleeding, tissue injury, clip migration, and inadequate sampling. Informed written consent was given. The usual time  out protocol was performed immediately prior to the procedure. Using sterile technique and 1% lidocaine  and 1% lidocaine  with epinephrine  as local anesthetic, under stereotactic guidance, a 9 gauge vacuum assisted device was used to perform core needle biopsy of calcifications in the lower outer quadrant of the RIGHT breast using a LATERAL approach. Specimen radiograph was performed showing representative calcifications. Specimens with calcifications are identified for pathology. Lesion quadrant: Lower outer At the conclusion of the procedure, X shaped tissue marker clip was deployed into the biopsy cavity. Follow-up 2-view mammogram was performed and dictated separately. IMPRESSION: Ultrasound guided biopsy and stereotactic guided biopsy of calcifications in the inferior RIGHT breast. No apparent complications. Electronically Signed: By: Norleen Croak M.D. On: 08/25/2023 09:00   MM CLIP PLACEMENT RIGHT Result Date: 08/25/2023 CLINICAL DATA:  Status post same day ultrasound and stereotactic guided biopsies of RIGHT breast calcifications EXAM: 3D DIAGNOSTIC RIGHT MAMMOGRAM POST ULTRASOUND and STEREOTACTIC BIOPSIES COMPARISON:  Previous exam(s). ACR Breast Density Category b: There are scattered areas of fibroglandular density. FINDINGS: 3D Mammographic images were obtained following ultrasound and stereotactic guided biopsies of calcifications in the inferior RIGHT breast. The Venus clip is in expected location at the site of ultrasound-guided, anterior depth biopsy. The X shaped clip is in appropriate position at the posterior extent of the calcifications at the site of middle depth biopsy. IMPRESSION: Appropriate positioning of biopsy marking clips at the sites of biopsy in the inferior RIGHT breast, as above. Final Assessment: Post Procedure Mammograms for Marker Placement Electronically Signed   By: Norleen Croak M.D.   On: 08/25/2023 09:04   MM 3D DIAGNOSTIC MAMMOGRAM BILATERAL BREAST Result Date:  08/12/2023 CLINICAL DATA:  LEFT breast palpable EXAM: DIGITAL DIAGNOSTIC BILATERAL MAMMOGRAM WITH TOMOSYNTHESIS AND CAD; ULTRASOUND RIGHT BREAST LIMITED; ULTRASOUND LEFT BREAST LIMITED TECHNIQUE: Bilateral digital diagnostic mammography and breast tomosynthesis was performed. The images were evaluated with computer-aided detection. ; Targeted ultrasound examination of the right breast was performed; Targeted ultrasound examination of the left breast was performed. COMPARISON:  Previous exam(s). ACR Breast Density Category b: There are scattered areas of fibroglandular density. FINDINGS: Spot compression tomosynthesis views were obtained over the palpable area of concern in the LEFT breast. No suspicious mammographic finding is identified in this area. There is focal skin thickening noted in this area. No suspicious mass, microcalcification, or other finding is identified in the LEFT breast. Spot magnification views of the RIGHT breast demonstrate a dominant group of punctate calcifications in a linear distribution in the RIGHT lower breast at anterior depth which spans 17 mm. Additional loosely associated scattered and loosely grouped punctate calcifications in the lower outer breast at middle depth span approximately 5 cm inclusive of the dominant group. On physical exam, there is a superficial dermal papule at the site of palpable concern in the LEFT upper outer breast. No suspicious mass is appreciated in the RIGHT retroareolar breast. Targeted LEFT breast ultrasound was performed in the palpable area of concern at the upper outer breast. There is an intradermal mass noted the site of palpable concern which is predominantly cystic in appearance. It measures 6  x 3 by 6 mm. A tract is noted to the skin. This corresponds to the area of focal skin thickening noted mammographically. Targeted ultrasound was performed of the RIGHT lower breast. At 6 o'clock in the retroareolar breast, there is a duct with internal  echogenic foci most consistent with the calcifications at anterior depth noted mammographically. This area spans approximately 6 mm. IMPRESSION: 1. There is a benign appearing intradermal mass at the site of palpable concern in the LEFT breast most consistent with an epidermal inclusion cyst. Recommend clinical management of this mass. 2. There is approximately 5 cm of indeterminate calcifications in the RIGHT breast with a dominant 17 mm group in the RIGHT lower breast at anterior depth. There is a favored sonographic correlate for these anterior calcifications at 6 o'clock in the retroareolar breast. Recommend ultrasound-guided biopsy of these calcifications at 6 o'clock followed by stereotactic guided biopsy of a second representative area of calcifications at middle depth for definitive characterization. 3. No mammographic evidence of malignancy in the LEFT breast. RECOMMENDATION: 1. RIGHT breast ultrasound-guided biopsy x1 2. RIGHT breast stereotactic guided biopsy x1 (second stereotactic guided biopsy if deemed necessary due to ultrasound/mammogram discordance). I have discussed the findings and recommendations with the patient. The biopsy procedure was discussed with the patient and questions were answered. Patient expressed their understanding of the biopsy recommendation. Patient will be scheduled for biopsy at her earliest convenience by the schedulers. Ordering provider will be notified. If applicable, a reminder letter will be sent to the patient regarding the next appointment. BI-RADS CATEGORY  4: Suspicious. Electronically Signed   By: Corean Salter M.D.   On: 08/12/2023 14:51   US  LIMITED ULTRASOUND INCLUDING AXILLA LEFT BREAST  Result Date: 08/12/2023 CLINICAL DATA:  LEFT breast palpable EXAM: DIGITAL DIAGNOSTIC BILATERAL MAMMOGRAM WITH TOMOSYNTHESIS AND CAD; ULTRASOUND RIGHT BREAST LIMITED; ULTRASOUND LEFT BREAST LIMITED TECHNIQUE: Bilateral digital diagnostic mammography and breast  tomosynthesis was performed. The images were evaluated with computer-aided detection. ; Targeted ultrasound examination of the right breast was performed; Targeted ultrasound examination of the left breast was performed. COMPARISON:  Previous exam(s). ACR Breast Density Category b: There are scattered areas of fibroglandular density. FINDINGS: Spot compression tomosynthesis views were obtained over the palpable area of concern in the LEFT breast. No suspicious mammographic finding is identified in this area. There is focal skin thickening noted in this area. No suspicious mass, microcalcification, or other finding is identified in the LEFT breast. Spot magnification views of the RIGHT breast demonstrate a dominant group of punctate calcifications in a linear distribution in the RIGHT lower breast at anterior depth which spans 17 mm. Additional loosely associated scattered and loosely grouped punctate calcifications in the lower outer breast at middle depth span approximately 5 cm inclusive of the dominant group. On physical exam, there is a superficial dermal papule at the site of palpable concern in the LEFT upper outer breast. No suspicious mass is appreciated in the RIGHT retroareolar breast. Targeted LEFT breast ultrasound was performed in the palpable area of concern at the upper outer breast. There is an intradermal mass noted the site of palpable concern which is predominantly cystic in appearance. It measures 6 x 3 by 6 mm. A tract is noted to the skin. This corresponds to the area of focal skin thickening noted mammographically. Targeted ultrasound was performed of the RIGHT lower breast. At 6 o'clock in the retroareolar breast, there is a duct with internal echogenic foci most consistent with the calcifications at anterior depth  noted mammographically. This area spans approximately 6 mm. IMPRESSION: 1. There is a benign appearing intradermal mass at the site of palpable concern in the LEFT breast most  consistent with an epidermal inclusion cyst. Recommend clinical management of this mass. 2. There is approximately 5 cm of indeterminate calcifications in the RIGHT breast with a dominant 17 mm group in the RIGHT lower breast at anterior depth. There is a favored sonographic correlate for these anterior calcifications at 6 o'clock in the retroareolar breast. Recommend ultrasound-guided biopsy of these calcifications at 6 o'clock followed by stereotactic guided biopsy of a second representative area of calcifications at middle depth for definitive characterization. 3. No mammographic evidence of malignancy in the LEFT breast. RECOMMENDATION: 1. RIGHT breast ultrasound-guided biopsy x1 2. RIGHT breast stereotactic guided biopsy x1 (second stereotactic guided biopsy if deemed necessary due to ultrasound/mammogram discordance). I have discussed the findings and recommendations with the patient. The biopsy procedure was discussed with the patient and questions were answered. Patient expressed their understanding of the biopsy recommendation. Patient will be scheduled for biopsy at her earliest convenience by the schedulers. Ordering provider will be notified. If applicable, a reminder letter will be sent to the patient regarding the next appointment. BI-RADS CATEGORY  4: Suspicious. Electronically Signed   By: Corean Salter M.D.   On: 08/12/2023 14:51   US  LIMITED ULTRASOUND INCLUDING AXILLA RIGHT BREAST Result Date: 08/12/2023 CLINICAL DATA:  LEFT breast palpable EXAM: DIGITAL DIAGNOSTIC BILATERAL MAMMOGRAM WITH TOMOSYNTHESIS AND CAD; ULTRASOUND RIGHT BREAST LIMITED; ULTRASOUND LEFT BREAST LIMITED TECHNIQUE: Bilateral digital diagnostic mammography and breast tomosynthesis was performed. The images were evaluated with computer-aided detection. ; Targeted ultrasound examination of the right breast was performed; Targeted ultrasound examination of the left breast was performed. COMPARISON:  Previous exam(s). ACR  Breast Density Category b: There are scattered areas of fibroglandular density. FINDINGS: Spot compression tomosynthesis views were obtained over the palpable area of concern in the LEFT breast. No suspicious mammographic finding is identified in this area. There is focal skin thickening noted in this area. No suspicious mass, microcalcification, or other finding is identified in the LEFT breast. Spot magnification views of the RIGHT breast demonstrate a dominant group of punctate calcifications in a linear distribution in the RIGHT lower breast at anterior depth which spans 17 mm. Additional loosely associated scattered and loosely grouped punctate calcifications in the lower outer breast at middle depth span approximately 5 cm inclusive of the dominant group. On physical exam, there is a superficial dermal papule at the site of palpable concern in the LEFT upper outer breast. No suspicious mass is appreciated in the RIGHT retroareolar breast. Targeted LEFT breast ultrasound was performed in the palpable area of concern at the upper outer breast. There is an intradermal mass noted the site of palpable concern which is predominantly cystic in appearance. It measures 6 x 3 by 6 mm. A tract is noted to the skin. This corresponds to the area of focal skin thickening noted mammographically. Targeted ultrasound was performed of the RIGHT lower breast. At 6 o'clock in the retroareolar breast, there is a duct with internal echogenic foci most consistent with the calcifications at anterior depth noted mammographically. This area spans approximately 6 mm. IMPRESSION: 1. There is a benign appearing intradermal mass at the site of palpable concern in the LEFT breast most consistent with an epidermal inclusion cyst. Recommend clinical management of this mass. 2. There is approximately 5 cm of indeterminate calcifications in the RIGHT breast with a dominant  17 mm group in the RIGHT lower breast at anterior depth. There is a  favored sonographic correlate for these anterior calcifications at 6 o'clock in the retroareolar breast. Recommend ultrasound-guided biopsy of these calcifications at 6 o'clock followed by stereotactic guided biopsy of a second representative area of calcifications at middle depth for definitive characterization. 3. No mammographic evidence of malignancy in the LEFT breast. RECOMMENDATION: 1. RIGHT breast ultrasound-guided biopsy x1 2. RIGHT breast stereotactic guided biopsy x1 (second stereotactic guided biopsy if deemed necessary due to ultrasound/mammogram discordance). I have discussed the findings and recommendations with the patient. The biopsy procedure was discussed with the patient and questions were answered. Patient expressed their understanding of the biopsy recommendation. Patient will be scheduled for biopsy at her earliest convenience by the schedulers. Ordering provider will be notified. If applicable, a reminder letter will be sent to the patient regarding the next appointment. BI-RADS CATEGORY  4: Suspicious. Electronically Signed   By: Corean Salter M.D.   On: 08/12/2023 14:51

## 2023-10-19 NOTE — Telephone Encounter (Signed)
 Patient calls, wanting results on her CT scan that was done on 10/17/23.  She is seeing results in her MyChart and needs more clarification about what she is seeing.  Please call her. Thank you

## 2023-10-19 NOTE — Assessment & Plan Note (Addendum)
 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 Recommend adjuvant radiation followed by endocrine therapy with AI for 5 years. Obtain baseline DEXA.

## 2023-10-19 NOTE — Progress Notes (Signed)
 NEW PATIENT EVALUATION  Name: Sherri Forbes  MRN: [id number]  Date:   10/19/2023     DOB: July 10, 1970   This 53 y.o. 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  REFERRING PHYSICIAN: Zachary Idelia LABOR, MD  CHIEF COMPLAINT:  Chief Complaint  Patient presents with   Breast Cancer    DIAGNOSIS: The encounter diagnosis was Ductal carcinoma in situ (DCIS) of right breast.   PREVIOUS INVESTIGATIONS:  Mammogram ultrasound reviewed CT scan of abdomen pelvis reviewed Pathology reports reviewed Clinical notes reviewed  HPI: Patient is a 53 year old female who self palpated a lesion in her left breast.  This turned out to be a probable epidermal inclusion cyst.  She did undergo mammograms showing a 5 cm of indeterminate calcifications in the right breast with a dominant 1.7 cm group in the right lower breast.  Left breast was free of any evidence of malignancy there was no evidence of axillary adenopathy.  She underwent biopsy for ER positive ductal carcinoma in situ with comedonecrosis.  She underwent a wide local excision for nuclear grade 2 ductal carcinoma in situ with micropapillary features also with comedonecrosis.  Tumor extended 3.3 cm DCIS was present less than 1 mm from the anterior lateral and superior margins.  She has been having some problems thought to be related to her gallbladder although recent CT scan showed no evidence of cholecystitis or gallstones.  There can continue to observe that.  She is seen today for radiation oncology opinion she specifically denies breast tenderness cough or bone pain.  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    PAST SURGICAL HISTORY:  Past Surgical History:  Procedure Laterality Date   APPENDECTOMY  11/23/2016   Dr. Nicholaus   BREAST BIOPSY Right 08/25/2023   US  RT BREAST BX W LOC DEV 1ST LESION IMG BX SPEC US  GUIDE 08/25/2023 [facility]   BREAST BIOPSY Right 08/25/2023   MM RT BREAST BX W LOC DEV 1ST LESION IMAGE BX SPEC STEREO GUIDE 08/25/2023 [facility]   BREAST BIOPSY Right 09/21/2023   MM RT BREAST SAVI/RF TAG 1ST LESION MAMMO GUIDE 09/21/2023 [facility]   BREAST BIOPSY Right 09/21/2023   US  RT BREAST SAVI/RF TAG 1ST LESION US  GUIDE 09/21/2023 [facility]   BREAST LUMPECTOMY WITH RADIO FREQUENCY LOCALIZER Right 09/28/2023   Procedure: BREAST LUMPECTOMY WITH RADIO FREQUENCY LOCALIZER;  Surgeon: Lane Shope, MD;  Location: [facility];  Service: General;  Laterality: Right;   COLONOSCOPY WITH PROPOFOL  N/A 03/09/2021   Procedure: COLONOSCOPY WITH PROPOFOL ;  Surgeon: Onita Elspeth Sharper, DO;  Location: [facility]COPY;  Service: Gastroenterology;  Laterality: N/A;   KIDNEY STONE SURGERY     LAPAROSCOPIC APPENDECTOMY N/A 11/23/2016   Procedure: APPENDECTOMY LAPAROSCOPIC;  Surgeon: Nicholaus Selinda Birmingham, MD;  Location: [facility];  Service: General;  Laterality: N/A;    FAMILY HISTORY: family history includes Breast cancer (age of onset: 12) in her maternal grandmother; COPD in her mother; Colon cancer in her father; Emphysema in her maternal grandmother; Hypertension in her  mother; Lung cancer in her mother; Melanoma in her maternal grandmother.  SOCIAL HISTORY:  reports that she quit smoking about 15 years ago. Her smoking use included cigarettes. She has never used smokeless tobacco. She reports current  alcohol use. She reports that she does not use drugs.  ALLERGIES: Penicillins and Sulfa antibiotics  MEDICATIONS:  Current Outpatient Medications  Medication Sig Dispense Refill   busPIRone (BUSPAR) 7.5 MG tablet Take 7.5 mg by mouth 2 (two) times daily.     hydrALAZINE (APRESOLINE) 50 MG tablet Take 50 mg by mouth 2 (two) times daily.     HYDROcodone -acetaminophen  (NORCO/VICODIN) 5-325 MG tablet Take 1 tablet by mouth every 6 (six) hours as needed for moderate pain (pain score 4-6). 15 tablet 0   lisinopril (ZESTRIL) 40 MG tablet Take 40 mg by mouth at bedtime.     methenamine (HIPREX) 1 g tablet Take 1 g by mouth 2 (two) times daily with a meal.     OZEMPIC, 0.25 OR 0.5 MG/DOSE, 2 MG/3ML SOPN Inject 0.25 mg into the skin once a week. Saturdays     pantoprazole  (PROTONIX ) 20 MG tablet TAKE 1 TABLET(20 MG) BY MOUTH DAILY 90 tablet 1   PARoxetine (PAXIL) 20 MG tablet Take 20 mg by mouth at bedtime.     simvastatin (ZOCOR) 40 MG tablet Take 40 mg by mouth at bedtime.     No current facility-administered medications for this encounter.    ECOG PERFORMANCE STATUS:  0 - Asymptomatic  REVIEW OF SYSTEMS: Patient denies any weight loss, fatigue, weakness, fever, chills or night sweats. Patient denies any loss of vision, blurred vision. Patient denies any ringing  of the ears or hearing loss. No irregular heartbeat. Patient denies heart murmur or history of fainting. Patient denies any chest pain or pain radiating to her upper extremities. Patient denies any shortness of breath, difficulty breathing at night, cough or hemoptysis. Patient denies any swelling in the lower legs. Patient denies any nausea vomiting, vomiting of blood, or coffee ground material in the vomitus. Patient denies any stomach pain. Patient states has had normal bowel movements no significant constipation or diarrhea. Patient denies any dysuria, hematuria or significant nocturia. Patient denies any problems walking, swelling in the  joints or loss of balance. Patient denies any skin changes, loss of hair or loss of weight. Patient denies any excessive worrying or anxiety or significant depression. Patient denies any problems with insomnia. Patient denies excessive thirst, polyuria, polydipsia. Patient denies any swollen glands, patient denies easy bruising or easy bleeding. Patient denies any recent infections, allergies or URI. Patient s visual fields have not changed significantly in recent time.   PHYSICAL EXAM: LMP 07/07/2016  Right breast is a wide local excision near the nipple areolar complex which is healing well.  No dominant masses noted in either breast.  No axillary or supraclavicular adenopathy is identified.  Well-developed well-nourished patient in NAD. HEENT reveals PERLA, EOMI, discs not visualized.  Oral cavity is clear. No oral mucosal lesions are identified. Neck is clear without evidence of cervical or supraclavicular adenopathy. Lungs are clear to A&P. Cardiac examination is essentially unremarkable with regular rate and rhythm without murmur rub or thrill. Abdomen is benign with no organomegaly or masses noted. Motor sensory and DTR levels are equal and symmetric in the upper and lower extremities. Cranial nerves II through XII are grossly intact. Proprioception is intact. No peripheral adenopathy or edema is identified. No motor or sensory levels are noted. Crude visual fields are  within normal range.  LABORATORY DATA: Pathology reports reviewed    RADIOLOGY RESULTS: Mammogram and ultrasound reviewed compatible with above-stated findings   IMPRESSION: DCIS of the right breast status post wide local excision in 53 year old female with clear but close margins at less than 1 mm  PLAN: At this time I have recommended whole breast radiation hypofractionated course of treatment over 3 weeks.  Would also boost her scar another 1600 cGy using photon beam based on the close less than 1 mm margin.  Do not think we  need reexcision at this time.  Risks and benefits of treatment including skin reaction fatigue alteration of blood counts possible inclusion of superficial lung all were reviewed in detail with the patient.  She seems to comprehend my treatment plan well.  I have personally set up and ordered CT simulation for tomorrow.  Patient also will benefit from endocrine therapy after completion of radiation.  I would like to take this opportunity to thank you for allowing me to participate in the care of your patient.SABRA Marcey Penton, MD

## 2023-10-20 ENCOUNTER — Encounter: Payer: Self-pay | Admitting: *Deleted

## 2023-10-20 ENCOUNTER — Other Ambulatory Visit: Payer: Self-pay

## 2023-10-20 ENCOUNTER — Ambulatory Visit
Admission: RE | Admit: 2023-10-20 | Discharge: 2023-10-20 | Attending: Radiation Oncology | Admitting: Radiation Oncology

## 2023-10-20 DIAGNOSIS — D0511 Intraductal carcinoma in situ of right breast: Secondary | ICD-10-CM

## 2023-10-20 DIAGNOSIS — Z510 Encounter for antineoplastic radiation therapy: Secondary | ICD-10-CM | POA: Diagnosis not present

## 2023-10-21 ENCOUNTER — Encounter: Payer: Self-pay | Admitting: *Deleted

## 2023-10-25 ENCOUNTER — Other Ambulatory Visit: Payer: Self-pay | Admitting: *Deleted

## 2023-10-25 DIAGNOSIS — Z510 Encounter for antineoplastic radiation therapy: Secondary | ICD-10-CM | POA: Diagnosis not present

## 2023-10-25 DIAGNOSIS — D0511 Intraductal carcinoma in situ of right breast: Secondary | ICD-10-CM

## 2023-10-26 ENCOUNTER — Ambulatory Visit
Admission: RE | Admit: 2023-10-26 | Discharge: 2023-10-26 | Disposition: A | Discharge status: Home / Self Care | Source: Ambulatory Visit | Attending: Surgery | Admitting: Surgery

## 2023-10-26 DIAGNOSIS — R1011 Right upper quadrant pain: Secondary | ICD-10-CM | POA: Diagnosis present

## 2023-10-26 MED ORDER — TECHNETIUM TC 99M MEBROFENIN IV KIT
5.0000 | PACK | Freq: Once | INTRAVENOUS | Status: AC | PRN
  Administered 2023-10-26: 5.35 via INTRAVENOUS

## 2023-10-27 ENCOUNTER — Ambulatory Visit

## 2023-10-27 ENCOUNTER — Ambulatory Visit
Admission: RE | Admit: 2023-10-27 | Discharge: 2023-10-27 | Disposition: A | Source: Ambulatory Visit | Attending: Radiation Oncology | Admitting: Radiation Oncology

## 2023-10-27 DIAGNOSIS — Z510 Encounter for antineoplastic radiation therapy: Secondary | ICD-10-CM | POA: Diagnosis not present

## 2023-10-31 ENCOUNTER — Other Ambulatory Visit: Payer: Self-pay

## 2023-10-31 ENCOUNTER — Ambulatory Visit
Admission: RE | Admit: 2023-10-31 | Discharge: 2023-10-31 | Disposition: A | Discharge status: Home / Self Care | Source: Ambulatory Visit | Attending: Radiation Oncology | Admitting: Radiation Oncology

## 2023-10-31 DIAGNOSIS — Z510 Encounter for antineoplastic radiation therapy: Secondary | ICD-10-CM | POA: Diagnosis not present

## 2023-10-31 LAB — RAD ONC ARIA SESSION SUMMARY
Course Elapsed Days: 0
Plan Fractions Treated to Date: 1
Plan Prescribed Dose Per Fraction: 2.66 Gy
Plan Total Fractions Prescribed: 16
Plan Total Prescribed Dose: 42.56 Gy
Reference Point Dosage Given to Date: 2.66 Gy
Reference Point Session Dosage Given: 2.66 Gy
Session Number: 1

## 2023-11-01 ENCOUNTER — Ambulatory Visit
Admission: RE | Admit: 2023-11-01 | Discharge: 2023-11-01 | Disposition: A | Source: Ambulatory Visit | Attending: Radiation Oncology | Admitting: Radiation Oncology

## 2023-11-01 ENCOUNTER — Other Ambulatory Visit: Payer: Self-pay

## 2023-11-01 DIAGNOSIS — Z510 Encounter for antineoplastic radiation therapy: Secondary | ICD-10-CM | POA: Diagnosis not present

## 2023-11-01 LAB — RAD ONC ARIA SESSION SUMMARY
Course Elapsed Days: 1
Plan Fractions Treated to Date: 2
Plan Prescribed Dose Per Fraction: 2.66 Gy
Plan Total Fractions Prescribed: 16
Plan Total Prescribed Dose: 42.56 Gy
Reference Point Dosage Given to Date: 5.32 Gy
Reference Point Session Dosage Given: 2.66 Gy
Session Number: 2

## 2023-11-02 ENCOUNTER — Other Ambulatory Visit: Payer: Self-pay

## 2023-11-02 ENCOUNTER — Ambulatory Visit
Admission: RE | Admit: 2023-11-02 | Discharge: 2023-11-02 | Disposition: A | Discharge status: Home / Self Care | Source: Ambulatory Visit | Attending: Radiation Oncology | Admitting: Radiation Oncology

## 2023-11-02 DIAGNOSIS — Z510 Encounter for antineoplastic radiation therapy: Secondary | ICD-10-CM | POA: Diagnosis not present

## 2023-11-02 LAB — RAD ONC ARIA SESSION SUMMARY
Course Elapsed Days: 2
Plan Fractions Treated to Date: 3
Plan Prescribed Dose Per Fraction: 2.66 Gy
Plan Total Fractions Prescribed: 16
Plan Total Prescribed Dose: 42.56 Gy
Reference Point Dosage Given to Date: 7.98 Gy
Reference Point Session Dosage Given: 2.66 Gy
Session Number: 3

## 2023-11-03 ENCOUNTER — Other Ambulatory Visit: Payer: Self-pay

## 2023-11-03 ENCOUNTER — Inpatient Hospital Stay

## 2023-11-03 ENCOUNTER — Ambulatory Visit
Admission: RE | Admit: 2023-11-03 | Discharge: 2023-11-03 | Disposition: A | Discharge status: Home / Self Care | Source: Ambulatory Visit | Attending: Radiation Oncology | Admitting: Radiation Oncology

## 2023-11-03 DIAGNOSIS — Z510 Encounter for antineoplastic radiation therapy: Secondary | ICD-10-CM | POA: Diagnosis not present

## 2023-11-03 LAB — RAD ONC ARIA SESSION SUMMARY
Course Elapsed Days: 3
Plan Fractions Treated to Date: 4
Plan Prescribed Dose Per Fraction: 2.66 Gy
Plan Total Fractions Prescribed: 16
Plan Total Prescribed Dose: 42.56 Gy
Reference Point Dosage Given to Date: 10.64 Gy
Reference Point Session Dosage Given: 2.66 Gy
Session Number: 4

## 2023-11-04 ENCOUNTER — Ambulatory Visit
Admission: RE | Admit: 2023-11-04 | Discharge: 2023-11-04 | Disposition: A | Discharge status: Home / Self Care | Source: Ambulatory Visit | Attending: Radiation Oncology | Admitting: Radiation Oncology

## 2023-11-04 ENCOUNTER — Other Ambulatory Visit: Payer: Self-pay

## 2023-11-04 ENCOUNTER — Inpatient Hospital Stay

## 2023-11-04 DIAGNOSIS — Z510 Encounter for antineoplastic radiation therapy: Secondary | ICD-10-CM | POA: Diagnosis not present

## 2023-11-04 DIAGNOSIS — D0511 Intraductal carcinoma in situ of right breast: Secondary | ICD-10-CM

## 2023-11-04 LAB — CBC (CANCER CENTER ONLY)
HCT: 41.5 % (ref 36.0–46.0)
Hemoglobin: 14 g/dL (ref 12.0–15.0)
MCH: 28.1 pg (ref 26.0–34.0)
MCHC: 33.7 g/dL (ref 30.0–36.0)
MCV: 83.3 fL (ref 80.0–100.0)
Platelet Count: 239 K/uL (ref 150–400)
RBC: 4.98 MIL/uL (ref 3.87–5.11)
RDW: 13.4 % (ref 11.5–15.5)
WBC Count: 6.3 K/uL (ref 4.0–10.5)
nRBC: 0 % (ref 0.0–0.2)

## 2023-11-04 LAB — RAD ONC ARIA SESSION SUMMARY
Course Elapsed Days: 4
Plan Fractions Treated to Date: 5
Plan Prescribed Dose Per Fraction: 2.66 Gy
Plan Total Fractions Prescribed: 16
Plan Total Prescribed Dose: 42.56 Gy
Reference Point Dosage Given to Date: 13.3 Gy
Reference Point Session Dosage Given: 2.66 Gy
Session Number: 5

## 2023-11-07 ENCOUNTER — Ambulatory Visit
Admission: RE | Admit: 2023-11-07 | Discharge: 2023-11-07 | Disposition: A | Discharge status: Home / Self Care | Source: Ambulatory Visit | Attending: Radiation Oncology | Admitting: Radiation Oncology

## 2023-11-07 ENCOUNTER — Other Ambulatory Visit: Payer: Self-pay

## 2023-11-07 DIAGNOSIS — Z510 Encounter for antineoplastic radiation therapy: Secondary | ICD-10-CM | POA: Diagnosis not present

## 2023-11-07 LAB — RAD ONC ARIA SESSION SUMMARY
Course Elapsed Days: 7
Plan Fractions Treated to Date: 6
Plan Prescribed Dose Per Fraction: 2.66 Gy
Plan Total Fractions Prescribed: 16
Plan Total Prescribed Dose: 42.56 Gy
Reference Point Dosage Given to Date: 15.96 Gy
Reference Point Session Dosage Given: 2.66 Gy
Session Number: 6

## 2023-11-08 ENCOUNTER — Ambulatory Visit
Admission: RE | Admit: 2023-11-08 | Discharge: 2023-11-08 | Disposition: A | Discharge status: Home / Self Care | Source: Ambulatory Visit | Attending: Radiation Oncology | Admitting: Radiation Oncology

## 2023-11-08 ENCOUNTER — Other Ambulatory Visit: Payer: Self-pay

## 2023-11-08 DIAGNOSIS — Z510 Encounter for antineoplastic radiation therapy: Secondary | ICD-10-CM | POA: Diagnosis not present

## 2023-11-08 LAB — RAD ONC ARIA SESSION SUMMARY
Course Elapsed Days: 8
Plan Fractions Treated to Date: 7
Plan Prescribed Dose Per Fraction: 2.66 Gy
Plan Total Fractions Prescribed: 16
Plan Total Prescribed Dose: 42.56 Gy
Reference Point Dosage Given to Date: 18.62 Gy
Reference Point Session Dosage Given: 2.66 Gy
Session Number: 7

## 2023-11-09 ENCOUNTER — Ambulatory Visit
Admission: RE | Admit: 2023-11-09 | Discharge: 2023-11-09 | Disposition: A | Source: Ambulatory Visit | Attending: Radiation Oncology | Admitting: Radiation Oncology

## 2023-11-09 ENCOUNTER — Other Ambulatory Visit: Payer: Self-pay

## 2023-11-09 DIAGNOSIS — Z510 Encounter for antineoplastic radiation therapy: Secondary | ICD-10-CM | POA: Diagnosis not present

## 2023-11-09 LAB — RAD ONC ARIA SESSION SUMMARY
Course Elapsed Days: 9
Plan Fractions Treated to Date: 8
Plan Prescribed Dose Per Fraction: 2.66 Gy
Plan Total Fractions Prescribed: 16
Plan Total Prescribed Dose: 42.56 Gy
Reference Point Dosage Given to Date: 21.28 Gy
Reference Point Session Dosage Given: 2.66 Gy
Session Number: 8

## 2023-11-10 ENCOUNTER — Other Ambulatory Visit: Payer: Self-pay

## 2023-11-10 ENCOUNTER — Ambulatory Visit
Admission: RE | Admit: 2023-11-10 | Discharge: 2023-11-10 | Disposition: A | Discharge status: Home / Self Care | Source: Ambulatory Visit | Attending: Radiation Oncology | Admitting: Radiation Oncology

## 2023-11-10 DIAGNOSIS — Z510 Encounter for antineoplastic radiation therapy: Secondary | ICD-10-CM | POA: Diagnosis not present

## 2023-11-10 LAB — RAD ONC ARIA SESSION SUMMARY
Course Elapsed Days: 10
Plan Fractions Treated to Date: 9
Plan Prescribed Dose Per Fraction: 2.66 Gy
Plan Total Fractions Prescribed: 16
Plan Total Prescribed Dose: 42.56 Gy
Reference Point Dosage Given to Date: 23.94 Gy
Reference Point Session Dosage Given: 2.66 Gy
Session Number: 9

## 2023-11-11 ENCOUNTER — Other Ambulatory Visit: Payer: Self-pay

## 2023-11-11 ENCOUNTER — Ambulatory Visit
Admission: RE | Admit: 2023-11-11 | Discharge: 2023-11-11 | Disposition: A | Discharge status: Home / Self Care | Source: Ambulatory Visit | Attending: Radiation Oncology | Admitting: Radiation Oncology

## 2023-11-11 DIAGNOSIS — Z510 Encounter for antineoplastic radiation therapy: Secondary | ICD-10-CM | POA: Diagnosis not present

## 2023-11-11 LAB — RAD ONC ARIA SESSION SUMMARY
Course Elapsed Days: 11
Plan Fractions Treated to Date: 10
Plan Prescribed Dose Per Fraction: 2.66 Gy
Plan Total Fractions Prescribed: 16
Plan Total Prescribed Dose: 42.56 Gy
Reference Point Dosage Given to Date: 26.6 Gy
Reference Point Session Dosage Given: 2.66 Gy
Session Number: 10

## 2023-11-14 ENCOUNTER — Ambulatory Visit
Admission: RE | Admit: 2023-11-14 | Discharge: 2023-11-14 | Disposition: A | Source: Ambulatory Visit | Attending: Radiation Oncology | Admitting: Radiation Oncology

## 2023-11-14 ENCOUNTER — Other Ambulatory Visit: Payer: Self-pay

## 2023-11-14 DIAGNOSIS — Z510 Encounter for antineoplastic radiation therapy: Secondary | ICD-10-CM | POA: Diagnosis not present

## 2023-11-14 LAB — RAD ONC ARIA SESSION SUMMARY
Course Elapsed Days: 14
Plan Fractions Treated to Date: 11
Plan Prescribed Dose Per Fraction: 2.66 Gy
Plan Total Fractions Prescribed: 16
Plan Total Prescribed Dose: 42.56 Gy
Reference Point Dosage Given to Date: 29.26 Gy
Reference Point Session Dosage Given: 2.66 Gy
Session Number: 11

## 2023-11-15 ENCOUNTER — Ambulatory Visit
Admission: RE | Admit: 2023-11-15 | Discharge: 2023-11-15 | Disposition: A | Discharge status: Home / Self Care | Source: Ambulatory Visit | Attending: Radiation Oncology | Admitting: Radiation Oncology

## 2023-11-15 ENCOUNTER — Ambulatory Visit
Admission: RE | Admit: 2023-11-15 | Discharge: 2023-11-15 | Disposition: A | Source: Ambulatory Visit | Attending: Radiation Oncology | Admitting: Radiation Oncology

## 2023-11-15 ENCOUNTER — Other Ambulatory Visit: Payer: Self-pay

## 2023-11-15 ENCOUNTER — Emergency Department

## 2023-11-15 ENCOUNTER — Emergency Department
Admission: EM | Admit: 2023-11-15 | Discharge: 2023-11-15 | Disposition: A | Discharge status: Home / Self Care | Attending: Emergency Medicine | Admitting: Emergency Medicine

## 2023-11-15 DIAGNOSIS — Z510 Encounter for antineoplastic radiation therapy: Secondary | ICD-10-CM | POA: Diagnosis not present

## 2023-11-15 DIAGNOSIS — K8500 Idiopathic acute pancreatitis without necrosis or infection: Secondary | ICD-10-CM | POA: Insufficient documentation

## 2023-11-15 DIAGNOSIS — I10 Essential (primary) hypertension: Secondary | ICD-10-CM | POA: Insufficient documentation

## 2023-11-15 DIAGNOSIS — J189 Pneumonia, unspecified organism: Secondary | ICD-10-CM

## 2023-11-15 DIAGNOSIS — J181 Lobar pneumonia, unspecified organism: Secondary | ICD-10-CM | POA: Diagnosis not present

## 2023-11-15 DIAGNOSIS — R1013 Epigastric pain: Secondary | ICD-10-CM | POA: Diagnosis present

## 2023-11-15 LAB — COMPREHENSIVE METABOLIC PANEL WITH GFR
ALT: 9 U/L (ref 0–44)
AST: 13 U/L — ABNORMAL LOW (ref 15–41)
Albumin: 3.6 g/dL (ref 3.5–5.0)
Alkaline Phosphatase: 70 U/L (ref 38–126)
Anion gap: 15 (ref 5–15)
BUN: 12 mg/dL (ref 6–20)
CO2: 23 mmol/L (ref 22–32)
Calcium: 10 mg/dL (ref 8.9–10.3)
Chloride: 99 mmol/L (ref 98–111)
Creatinine, Ser: 0.99 mg/dL (ref 0.44–1.00)
GFR, Estimated: >60 mL/min (ref >60)
Glucose, Bld: 94 mg/dL (ref 70–99)
Potassium: 4.2 mmol/L (ref 3.5–5.1)
Sodium: 137 mmol/L (ref 135–145)
Total Bilirubin: 0.6 mg/dL (ref 0.0–1.2)
Total Protein: 8.5 g/dL — ABNORMAL HIGH (ref 6.5–8.1)

## 2023-11-15 LAB — RAD ONC ARIA SESSION SUMMARY
Course Elapsed Days: 15
Plan Fractions Treated to Date: 12
Plan Prescribed Dose Per Fraction: 2.66 Gy
Plan Total Fractions Prescribed: 16
Plan Total Prescribed Dose: 42.56 Gy
Reference Point Dosage Given to Date: 31.92 Gy
Reference Point Session Dosage Given: 2.66 Gy
Session Number: 12

## 2023-11-15 LAB — URINALYSIS, ROUTINE W REFLEX MICROSCOPIC
Bilirubin Urine: NEGATIVE
Glucose, UA: NEGATIVE mg/dL
Hgb urine dipstick: NEGATIVE
Ketones, ur: NEGATIVE mg/dL
Leukocytes,Ua: NEGATIVE
Nitrite: NEGATIVE
Protein, ur: NEGATIVE mg/dL
Specific Gravity, Urine: 1.006 (ref 1.005–1.030)
pH: 7 (ref 5.0–8.0)

## 2023-11-15 LAB — CBC
HCT: 41 % (ref 36.0–46.0)
Hemoglobin: 13.9 g/dL (ref 12.0–15.0)
MCH: 28.1 pg (ref 26.0–34.0)
MCHC: 33.9 g/dL (ref 30.0–36.0)
MCV: 83 fL (ref 80.0–100.0)
Platelets: 236 K/uL (ref 150–400)
RBC: 4.94 MIL/uL (ref 3.87–5.11)
RDW: 13.5 % (ref 11.5–15.5)
WBC: 5.4 K/uL (ref 4.0–10.5)
nRBC: 0 % (ref 0.0–0.2)

## 2023-11-15 LAB — LIPASE, BLOOD: Lipase: 53 U/L — ABNORMAL HIGH (ref 11–51)

## 2023-11-15 MED ORDER — TRAMADOL HCL 50 MG PO TABS
50.0000 mg | ORAL_TABLET | Freq: Four times a day (QID) | ORAL | 0 refills | Status: AC | PRN
Start: 2023-11-15 — End: 2023-11-20

## 2023-11-15 MED ORDER — AZITHROMYCIN 500 MG PO TABS
500.0000 mg | ORAL_TABLET | Freq: Once | ORAL | Status: AC
Start: 2023-11-15 — End: 2023-11-15
  Administered 2023-11-15: 500 mg via ORAL
  Filled 2023-11-15: qty 1

## 2023-11-15 MED ORDER — SODIUM CHLORIDE 0.9 % IV BOLUS
1000.0000 mL | Freq: Once | INTRAVENOUS | Status: AC
  Administered 2023-11-15: 1000 mL via INTRAVENOUS

## 2023-11-15 MED ORDER — IOHEXOL 300 MG/ML  SOLN
100.0000 mL | Freq: Once | INTRAMUSCULAR | Status: AC | PRN
  Administered 2023-11-15: 100 mL via INTRAVENOUS

## 2023-11-15 MED ORDER — ONDANSETRON 4 MG PO TBDP: 4.0000 mg | ORAL_TABLET | Freq: Three times a day (TID) | ORAL | 0 refills | Status: AC | PRN

## 2023-11-15 MED ORDER — AZITHROMYCIN 250 MG PO TABS: 250.0000 mg | ORAL_TABLET | Freq: Every day | ORAL | 0 refills | Status: AC

## 2023-11-15 MED ORDER — SODIUM CHLORIDE 0.9 % IV BOLUS
500.0000 mL | Freq: Once | INTRAVENOUS | Status: AC
  Administered 2023-11-15: 500 mL via INTRAVENOUS

## 2023-11-15 NOTE — ED Notes (Signed)
 Pt takes Ozempic 2.5mg  weekly since about 2 months ago. Hx gallstones, has GB. Pain started last night (woke her up). Pain radiates to R back. Slightly tender to deep palpation. Respirations unlabored, skin dry.

## 2023-11-15 NOTE — ED Triage Notes (Signed)
 C/O abdominal pain radiating to right back.  Patient presents from Radiation for Breast Ca. Lumpectomy to right breast September 28, 2023.  Also reports fever at radiation today and rash to chest.

## 2023-11-15 NOTE — ED Provider Notes (Signed)
[facility] Provider Note    Event Date/Time   First MD Initiated Contact with Patient 11/15/23 1019     (approximate)   History   Abdominal Pain   HPI  Sherri Forbes is a 53 y.o. female with a history of hypertension, hyperlipidemia, prediabetes, breast DCIS, and anxiety who presents with epigastric abdominal pain, acute onset since last night, although the patient has had several episodes of this pain before.  She was also noted to have a fever when she was at radiation today.  She reports nausea but no vomiting.  Her stools have been pasty but no diarrhea.  She denies any chest pain or difficulty breathing.  She has no cough.  She denies urinary symptoms.  I reviewed the past medical records.  The patient's most recent outpatient encounter was on 10/24 with family medicine for management of her anxiety.  The patient had a RUQ ultrasound last month which showed hepatomegaly and steatosis but no acute findings with the gallbladder.   Physical Exam   Triage Vital Signs: ED Triage Vitals  Encounter Vitals Group     BP 11/15/23 1012 (!) 149/94     Girls Systolic BP Percentile --      Girls Diastolic BP Percentile --      Boys Systolic BP Percentile --      Boys Diastolic BP Percentile --      Pulse Rate 11/15/23 1012 (!) 117     Resp 11/15/23 1012 16     Temp 11/15/23 1012 99.9 F (37.7 C)     Temp Source 11/15/23 1012 Oral     SpO2 11/15/23 1012 95 %     Weight 11/15/23 1013 236 lb 15.9 oz (107.5 kg)     Height --      Head Circumference --      Peak Flow --      Pain Score 11/15/23 1013 4     Pain Loc --      Pain Education --      Exclude from Growth Chart --     Most recent vital signs: Vitals:   11/15/23 1027 11/15/23 1510  BP:    Pulse: (!) 109   Resp:    Temp:  98.9 F (37.2 C)  SpO2: 96%      General: Awake, no distress.  CV:  Good peripheral perfusion.  Resp:  Normal effort.  Abd:  Soft with mild epigastric tenderness.   No distention.  Other:  No jaundice or scleral icterus.   ED Results / Procedures / Treatments   Labs (all labs ordered are listed, but only abnormal results are displayed) Labs Reviewed  LIPASE, BLOOD - Abnormal; Notable for the following components:      Result Value   Lipase 53 (*)    All other components within normal limits  COMPREHENSIVE METABOLIC PANEL WITH GFR - Abnormal; Notable for the following components:   Total Protein 8.5 (*)    AST 13 (*)    All other components within normal limits  URINALYSIS, ROUTINE W REFLEX MICROSCOPIC - Abnormal; Notable for the following components:   Color, Urine STRAW (*)    APPearance CLEAR (*)    All other components within normal limits  CBC     EKG  ED ECG REPORT I, Waylon Cassis, the attending physician, personally viewed and interpreted this ECG.  Date: 11/15/2023 EKG Time: 1153 Rate: 103 Rhythm: Sinus tachycardia QRS Axis: normal Intervals: Borderline prolonged QTc ST/T  Wave abnormalities: Nonspecific T wave abnormalities Narrative Interpretation: no evidence of acute ischemia    RADIOLOGY  CT abdomen/pelvis: I independently viewed and interpreted the images; there are no dilated bowel loops or any free air or free fluid.  Radiology report indicates no acute intra-abdominal abnormality, but with findings in the left lung base concerning for possible atelectasis versus infiltrate.  PROCEDURES:  Critical Care performed: No  Procedures   MEDICATIONS ORDERED IN ED: Medications  sodium chloride  0.9 % bolus 1,000 mL (0 mLs Intravenous Stopped 11/15/23 1429)  iohexol (OMNIPAQUE) 300 MG/ML solution 100 mL (100 mLs Intravenous Contrast Given 11/15/23 1250)  sodium chloride  0.9 % bolus 500 mL (0 mLs Intravenous Stopped 11/15/23 1551)  azithromycin (ZITHROMAX) tablet 500 mg (500 mg Oral Given 11/15/23 1509)     IMPRESSION / MDM / ASSESSMENT AND PLAN / ED COURSE  I reviewed the triage vital signs and the nursing  notes.  53 year old female with PMH as noted above presents with epigastric abdominal pain along with nausea.  She has had episodes of this pain previously.  On exam she has a borderline elevated temperature and is tachycardic.  She has mild epigastric tenderness.  Differential diagnosis includes, but is not limited to, viral gastroenteritis, foodborne illness, gastritis, gastroparesis, pancreatitis, biliary colic, acute cholecystitis, other hepatobiliary cause, colitis, diverticulitis.  The patient had an RUQ ultrasound about a month ago with no evidence of gallstones so I doubt biliary colic or cholecystitis.  We will obtain lab workup, CT, and reassess.  Patient's presentation is most consistent with acute complicated illness / injury requiring diagnostic workup.  ----------------------------------------- 3:30 PM on 11/15/2023 -----------------------------------------  CT is negative for acute findings in the abdomen although does show possible atelectasis versus infiltrate.  Lab workup is significant for slightly elevated lipase but no other acute findings.  There is no leukocytosis.  Urinalysis is negative.  Especially given the fever, there is possible that the patient has pneumonia.  She also likely has mild pancreatitis.  This appears to be idiopathic.  There is no evidence of gallstones and the patient does not drink alcohol.  However, she is overall well-appearing and tolerating p.o.  She is still borderline tachycardic so I have ordered an additional fluid bolus but she appears well.  She feels comfortable and wants to go home.  I think that discharge is reasonable.  I have counseled her on a bland diet and precautions both for pancreatitis and for CAP.  I have prescribed a course of azithromycin as well as some pain and nausea medication.  I gave strict return precautions, and she expressed understanding.   FINAL CLINICAL IMPRESSION(S) / ED DIAGNOSES   Final diagnoses:  Community  acquired pneumonia of left lower lobe of lung  Idiopathic acute pancreatitis without infection or necrosis     Rx / DC Orders   ED Discharge Orders          Ordered    azithromycin (ZITHROMAX) 250 MG tablet  Daily        11/15/23 1535    traMADol (ULTRAM) 50 MG tablet  Every 6 hours PRN        11/15/23 1535    ondansetron  (ZOFRAN -ODT) 4 MG disintegrating tablet  Every 8 hours PRN        11/15/23 1535             Note:  This document was prepared using Dragon voice recognition software and may include unintentional dictation errors.    Jacolyn Pae, MD  11/15/23 1605  

## 2023-11-15 NOTE — Discharge Instructions (Signed)
 Take the antibiotic as prescribed starting the day after the ER visit and finish the full 4-day course.  You may take the tramadol as needed for pain and Zofran  as needed for nausea.  Eat a bland diet for the next several days and gradually advance it to more normal foods.  Return to the ER for new, worsening, or persistent severe abdominal pain, vomiting, fever, weakness, or any other new or worsening symptoms that concern you.

## 2023-11-15 NOTE — ED Notes (Signed)
 1 set blood cultures drawn at this time (with IV insertion).

## 2023-11-16 ENCOUNTER — Ambulatory Visit

## 2023-11-16 DIAGNOSIS — Z510 Encounter for antineoplastic radiation therapy: Secondary | ICD-10-CM | POA: Diagnosis not present

## 2023-11-17 ENCOUNTER — Inpatient Hospital Stay

## 2023-11-17 ENCOUNTER — Ambulatory Visit

## 2023-11-18 ENCOUNTER — Ambulatory Visit

## 2023-11-21 ENCOUNTER — Ambulatory Visit

## 2023-11-22 ENCOUNTER — Other Ambulatory Visit: Payer: Self-pay

## 2023-11-22 ENCOUNTER — Ambulatory Visit

## 2023-11-22 DIAGNOSIS — Z87891 Personal history of nicotine dependence: Secondary | ICD-10-CM | POA: Insufficient documentation

## 2023-11-22 DIAGNOSIS — Z803 Family history of malignant neoplasm of breast: Secondary | ICD-10-CM | POA: Insufficient documentation

## 2023-11-22 DIAGNOSIS — Z170 Estrogen receptor positive status [ER+]: Secondary | ICD-10-CM | POA: Insufficient documentation

## 2023-11-22 DIAGNOSIS — D0511 Intraductal carcinoma in situ of right breast: Secondary | ICD-10-CM | POA: Diagnosis present

## 2023-11-22 DIAGNOSIS — Z800 Family history of malignant neoplasm of digestive organs: Secondary | ICD-10-CM | POA: Insufficient documentation

## 2023-11-22 DIAGNOSIS — Z801 Family history of malignant neoplasm of trachea, bronchus and lung: Secondary | ICD-10-CM | POA: Insufficient documentation

## 2023-11-22 DIAGNOSIS — Z510 Encounter for antineoplastic radiation therapy: Secondary | ICD-10-CM | POA: Diagnosis present

## 2023-11-22 DIAGNOSIS — R160 Hepatomegaly, not elsewhere classified: Secondary | ICD-10-CM | POA: Insufficient documentation

## 2023-11-22 LAB — RAD ONC ARIA SESSION SUMMARY
Course Elapsed Days: 22
Plan Fractions Treated to Date: 13
Plan Prescribed Dose Per Fraction: 2.66 Gy
Plan Total Fractions Prescribed: 16
Plan Total Prescribed Dose: 42.56 Gy
Reference Point Dosage Given to Date: 34.58 Gy
Reference Point Session Dosage Given: 2.66 Gy
Session Number: 13

## 2023-11-23 ENCOUNTER — Other Ambulatory Visit: Payer: Self-pay

## 2023-11-23 ENCOUNTER — Ambulatory Visit
Admission: RE | Admit: 2023-11-23 | Discharge: 2023-11-23 | Disposition: A | Source: Ambulatory Visit | Attending: Radiation Oncology | Admitting: Radiation Oncology

## 2023-11-23 DIAGNOSIS — Z510 Encounter for antineoplastic radiation therapy: Secondary | ICD-10-CM | POA: Diagnosis not present

## 2023-11-23 LAB — RAD ONC ARIA SESSION SUMMARY
Course Elapsed Days: 23
Plan Fractions Treated to Date: 14
Plan Prescribed Dose Per Fraction: 2.66 Gy
Plan Total Fractions Prescribed: 16
Plan Total Prescribed Dose: 42.56 Gy
Reference Point Dosage Given to Date: 37.24 Gy
Reference Point Session Dosage Given: 2.66 Gy
Session Number: 14

## 2023-11-24 ENCOUNTER — Inpatient Hospital Stay: Attending: Oncology

## 2023-11-24 ENCOUNTER — Ambulatory Visit

## 2023-11-24 ENCOUNTER — Other Ambulatory Visit: Payer: Self-pay

## 2023-11-24 ENCOUNTER — Ambulatory Visit
Admission: RE | Admit: 2023-11-24 | Discharge: 2023-11-24 | Disposition: A | Source: Ambulatory Visit | Attending: Radiation Oncology | Admitting: Radiation Oncology

## 2023-11-24 DIAGNOSIS — Z510 Encounter for antineoplastic radiation therapy: Secondary | ICD-10-CM | POA: Diagnosis not present

## 2023-11-24 DIAGNOSIS — D0511 Intraductal carcinoma in situ of right breast: Secondary | ICD-10-CM | POA: Insufficient documentation

## 2023-11-24 LAB — CBC (CANCER CENTER ONLY)
HCT: 42.2 % (ref 36.0–46.0)
Hemoglobin: 14.1 g/dL (ref 12.0–15.0)
MCH: 27.9 pg (ref 26.0–34.0)
MCHC: 33.4 g/dL (ref 30.0–36.0)
MCV: 83.4 fL (ref 80.0–100.0)
Platelet Count: 244 K/uL (ref 150–400)
RBC: 5.06 MIL/uL (ref 3.87–5.11)
RDW: 13.5 % (ref 11.5–15.5)
WBC Count: 4.6 K/uL (ref 4.0–10.5)
nRBC: 0 % (ref 0.0–0.2)

## 2023-11-24 LAB — RAD ONC ARIA SESSION SUMMARY
Course Elapsed Days: 24
Plan Fractions Treated to Date: 15
Plan Prescribed Dose Per Fraction: 2.66 Gy
Plan Total Fractions Prescribed: 16
Plan Total Prescribed Dose: 42.56 Gy
Reference Point Dosage Given to Date: 39.9 Gy
Reference Point Session Dosage Given: 2.66 Gy
Session Number: 15

## 2023-11-25 ENCOUNTER — Other Ambulatory Visit: Payer: Self-pay

## 2023-11-25 ENCOUNTER — Ambulatory Visit
Admission: RE | Admit: 2023-11-25 | Discharge: 2023-11-25 | Disposition: A | Discharge status: Home / Self Care | Source: Ambulatory Visit | Attending: Radiation Oncology | Admitting: Radiation Oncology

## 2023-11-25 ENCOUNTER — Ambulatory Visit

## 2023-11-25 DIAGNOSIS — Z510 Encounter for antineoplastic radiation therapy: Secondary | ICD-10-CM | POA: Diagnosis not present

## 2023-11-25 LAB — RAD ONC ARIA SESSION SUMMARY
Course Elapsed Days: 25
Plan Fractions Treated to Date: 16
Plan Prescribed Dose Per Fraction: 2.66 Gy
Plan Total Fractions Prescribed: 16
Plan Total Prescribed Dose: 42.56 Gy
Reference Point Dosage Given to Date: 42.56 Gy
Reference Point Session Dosage Given: 2.66 Gy
Session Number: 16

## 2023-11-28 ENCOUNTER — Ambulatory Visit

## 2023-11-28 ENCOUNTER — Ambulatory Visit
Admission: RE | Admit: 2023-11-28 | Discharge: 2023-11-28 | Disposition: A | Discharge status: Home / Self Care | Source: Ambulatory Visit | Attending: Radiation Oncology | Admitting: Radiation Oncology

## 2023-11-28 DIAGNOSIS — Z510 Encounter for antineoplastic radiation therapy: Secondary | ICD-10-CM | POA: Diagnosis not present

## 2023-11-29 ENCOUNTER — Ambulatory Visit
Admission: RE | Admit: 2023-11-29 | Discharge: 2023-11-29 | Disposition: A | Discharge status: Home / Self Care | Source: Ambulatory Visit | Attending: Radiation Oncology | Admitting: Radiation Oncology

## 2023-11-29 ENCOUNTER — Ambulatory Visit
Admission: RE | Admit: 2023-11-29 | Discharge: 2023-11-29 | Disposition: A | Discharge status: Home / Self Care | Source: Ambulatory Visit | Attending: Oncology | Admitting: Oncology

## 2023-11-29 ENCOUNTER — Other Ambulatory Visit: Payer: Self-pay

## 2023-11-29 DIAGNOSIS — D0511 Intraductal carcinoma in situ of right breast: Secondary | ICD-10-CM | POA: Insufficient documentation

## 2023-11-29 DIAGNOSIS — Z510 Encounter for antineoplastic radiation therapy: Secondary | ICD-10-CM | POA: Diagnosis not present

## 2023-11-29 LAB — RAD ONC ARIA SESSION SUMMARY
Course Elapsed Days: 29
Plan Fractions Treated to Date: 1
Plan Prescribed Dose Per Fraction: 2 Gy
Plan Total Fractions Prescribed: 8
Plan Total Prescribed Dose: 16 Gy
Reference Point Dosage Given to Date: 2 Gy
Reference Point Session Dosage Given: 2 Gy
Session Number: 17

## 2023-11-30 ENCOUNTER — Other Ambulatory Visit: Payer: Self-pay

## 2023-11-30 ENCOUNTER — Ambulatory Visit
Admission: RE | Admit: 2023-11-30 | Discharge: 2023-11-30 | Disposition: A | Discharge status: Home / Self Care | Source: Ambulatory Visit | Attending: Radiation Oncology | Admitting: Radiation Oncology

## 2023-11-30 DIAGNOSIS — Z510 Encounter for antineoplastic radiation therapy: Secondary | ICD-10-CM | POA: Diagnosis not present

## 2023-11-30 LAB — RAD ONC ARIA SESSION SUMMARY
Course Elapsed Days: 30
Plan Fractions Treated to Date: 2
Plan Prescribed Dose Per Fraction: 2 Gy
Plan Total Fractions Prescribed: 8
Plan Total Prescribed Dose: 16 Gy
Reference Point Dosage Given to Date: 4 Gy
Reference Point Session Dosage Given: 2 Gy
Session Number: 18

## 2023-12-01 ENCOUNTER — Other Ambulatory Visit: Payer: Self-pay

## 2023-12-01 ENCOUNTER — Ambulatory Visit

## 2023-12-01 ENCOUNTER — Ambulatory Visit
Admission: RE | Admit: 2023-12-01 | Discharge: 2023-12-01 | Disposition: A | Discharge status: Home / Self Care | Source: Ambulatory Visit | Attending: Radiation Oncology | Admitting: Radiation Oncology

## 2023-12-01 ENCOUNTER — Inpatient Hospital Stay

## 2023-12-01 DIAGNOSIS — D0511 Intraductal carcinoma in situ of right breast: Secondary | ICD-10-CM

## 2023-12-01 DIAGNOSIS — Z510 Encounter for antineoplastic radiation therapy: Secondary | ICD-10-CM | POA: Diagnosis not present

## 2023-12-01 LAB — RAD ONC ARIA SESSION SUMMARY
Course Elapsed Days: 31
Plan Fractions Treated to Date: 3
Plan Prescribed Dose Per Fraction: 2 Gy
Plan Total Fractions Prescribed: 8
Plan Total Prescribed Dose: 16 Gy
Reference Point Dosage Given to Date: 6 Gy
Reference Point Session Dosage Given: 2 Gy
Session Number: 19

## 2023-12-01 LAB — CBC (CANCER CENTER ONLY)
HCT: 42.9 % (ref 36.0–46.0)
Hemoglobin: 14.2 g/dL (ref 12.0–15.0)
MCH: 28.1 pg (ref 26.0–34.0)
MCHC: 33.1 g/dL (ref 30.0–36.0)
MCV: 85 fL (ref 80.0–100.0)
Platelet Count: 193 K/uL (ref 150–400)
RBC: 5.05 MIL/uL (ref 3.87–5.11)
RDW: 13.5 % (ref 11.5–15.5)
WBC Count: 4.8 K/uL (ref 4.0–10.5)
nRBC: 0 % (ref 0.0–0.2)

## 2023-12-02 ENCOUNTER — Other Ambulatory Visit: Payer: Self-pay

## 2023-12-02 ENCOUNTER — Ambulatory Visit
Admission: RE | Admit: 2023-12-02 | Discharge: 2023-12-02 | Disposition: A | Discharge status: Home / Self Care | Source: Ambulatory Visit | Attending: Radiation Oncology | Admitting: Radiation Oncology

## 2023-12-02 DIAGNOSIS — Z510 Encounter for antineoplastic radiation therapy: Secondary | ICD-10-CM | POA: Diagnosis not present

## 2023-12-02 LAB — RAD ONC ARIA SESSION SUMMARY
Course Elapsed Days: 32
Plan Fractions Treated to Date: 4
Plan Prescribed Dose Per Fraction: 2 Gy
Plan Total Fractions Prescribed: 8
Plan Total Prescribed Dose: 16 Gy
Reference Point Dosage Given to Date: 8 Gy
Reference Point Session Dosage Given: 2 Gy
Session Number: 20

## 2023-12-05 ENCOUNTER — Ambulatory Visit
Admission: RE | Admit: 2023-12-05 | Discharge: 2023-12-05 | Disposition: A | Discharge status: Home / Self Care | Source: Ambulatory Visit | Attending: Radiation Oncology | Admitting: Radiation Oncology

## 2023-12-05 ENCOUNTER — Other Ambulatory Visit: Payer: Self-pay

## 2023-12-05 DIAGNOSIS — Z510 Encounter for antineoplastic radiation therapy: Secondary | ICD-10-CM | POA: Diagnosis not present

## 2023-12-05 LAB — RAD ONC ARIA SESSION SUMMARY
Course Elapsed Days: 35
Plan Fractions Treated to Date: 5
Plan Prescribed Dose Per Fraction: 2 Gy
Plan Total Fractions Prescribed: 8
Plan Total Prescribed Dose: 16 Gy
Reference Point Dosage Given to Date: 10 Gy
Reference Point Session Dosage Given: 2 Gy
Session Number: 21

## 2023-12-06 ENCOUNTER — Other Ambulatory Visit: Payer: Self-pay

## 2023-12-06 ENCOUNTER — Ambulatory Visit

## 2023-12-06 ENCOUNTER — Ambulatory Visit
Admission: RE | Admit: 2023-12-06 | Discharge: 2023-12-06 | Disposition: A | Discharge status: Home / Self Care | Source: Ambulatory Visit | Attending: Radiation Oncology | Admitting: Radiation Oncology

## 2023-12-06 DIAGNOSIS — Z510 Encounter for antineoplastic radiation therapy: Secondary | ICD-10-CM | POA: Diagnosis not present

## 2023-12-06 LAB — RAD ONC ARIA SESSION SUMMARY
Course Elapsed Days: 36
Plan Fractions Treated to Date: 6
Plan Prescribed Dose Per Fraction: 2 Gy
Plan Total Fractions Prescribed: 8
Plan Total Prescribed Dose: 16 Gy
Reference Point Dosage Given to Date: 12 Gy
Reference Point Session Dosage Given: 2 Gy
Session Number: 22

## 2023-12-07 ENCOUNTER — Ambulatory Visit
Admission: RE | Admit: 2023-12-07 | Discharge: 2023-12-07 | Disposition: A | Discharge status: Home / Self Care | Source: Ambulatory Visit | Attending: Radiation Oncology | Admitting: Radiation Oncology

## 2023-12-07 ENCOUNTER — Ambulatory Visit

## 2023-12-07 ENCOUNTER — Other Ambulatory Visit: Payer: Self-pay

## 2023-12-07 DIAGNOSIS — Z510 Encounter for antineoplastic radiation therapy: Secondary | ICD-10-CM | POA: Diagnosis not present

## 2023-12-07 LAB — RAD ONC ARIA SESSION SUMMARY
Course Elapsed Days: 37
Plan Fractions Treated to Date: 7
Plan Prescribed Dose Per Fraction: 2 Gy
Plan Total Fractions Prescribed: 8
Plan Total Prescribed Dose: 16 Gy
Reference Point Dosage Given to Date: 14 Gy
Reference Point Session Dosage Given: 2 Gy
Session Number: 23

## 2023-12-08 ENCOUNTER — Encounter: Payer: Self-pay | Admitting: *Deleted

## 2023-12-08 ENCOUNTER — Other Ambulatory Visit: Payer: Self-pay

## 2023-12-08 ENCOUNTER — Ambulatory Visit
Admission: RE | Admit: 2023-12-08 | Discharge: 2023-12-08 | Disposition: A | Source: Ambulatory Visit | Attending: Radiation Oncology | Admitting: Radiation Oncology

## 2023-12-08 DIAGNOSIS — Z510 Encounter for antineoplastic radiation therapy: Secondary | ICD-10-CM | POA: Diagnosis not present

## 2023-12-08 LAB — RAD ONC ARIA SESSION SUMMARY
Course Elapsed Days: 38
Plan Fractions Treated to Date: 8
Plan Prescribed Dose Per Fraction: 2 Gy
Plan Total Fractions Prescribed: 8
Plan Total Prescribed Dose: 16 Gy
Reference Point Dosage Given to Date: 16 Gy
Reference Point Session Dosage Given: 2 Gy
Session Number: 24

## 2023-12-09 NOTE — Radiation Completion Notes (Signed)
 Patient Name: Sherri Forbes, Sherri Forbes MRN: [id number] Date of Birth: 10-13-1970 Referring Physician: IDELIA BARE, M.D. Date of Service: 2023-12-09 Radiation Oncologist: Marcey Penton, M.D. [facility]                             RADIATION ONCOLOGY END OF TREATMENT NOTE     Diagnosis: D05.11 Intraductal carcinoma in situ of right breast Staging on 2023-09-07: Ductal carcinoma in situ (DCIS) of right breast T=cTis (DCIS), N=cN0, M=cM0 Intent: Curative     HPI: Patient is a 53 year old female who self palpated a lesion in her left breast.  This turned out to be a probable epidermal inclusion cyst.  She did undergo mammograms showing a 5 cm of indeterminate calcifications in the right breast with a dominant 1.7 cm group in the right lower breast.  Left breast was free of any evidence of malignancy there was no evidence of axillary adenopathy.  She underwent biopsy for ER positive ductal carcinoma in situ with comedonecrosis.  She underwent a wide local excision for nuclear grade 2 ductal carcinoma in situ with micropapillary features also with comedonecrosis.  Tumor extended 3.3 cm DCIS was present less than 1 mm from the anterior lateral and superior margins.  She has been having some problems thought to be related to her gallbladder although recent CT scan showed no evidence of cholecystitis or gallstones.  There can continue to observe that.  She is seen today for radiation oncology opinion she specifically denies breast tenderness cough or bone pain.      ==========DELIVERED PLANS==========  First Treatment Date: 2023-10-31 Last Treatment Date: 2023-12-08   Plan Name: Breast_R Site: Breast, Right Technique: 3D Mode: Photon Dose Per Fraction: 2.66 Gy Prescribed Dose (Delivered / Prescribed): 42.56 Gy / 42.56 Gy Prescribed Fxs (Delivered / Prescribed): 16 / 16   Plan Name: Breast_R_Bst:1 Site: Breast, Right Technique: 3D Mode: Photon Dose Per Fraction: 2  Gy Prescribed Dose (Delivered / Prescribed): 16 Gy / 16 Gy Prescribed Fxs (Delivered / Prescribed): 8 / 8     ==========ON TREATMENT VISIT DATES========== 2023-11-01, 2023-11-08, 2023-11-15, 2023-11-22, 2023-11-29, 2023-12-06     ==========UPCOMING VISITS========== 01/16/2024 [facility] RAD ONCOLOGY FOLLOW UP 30 Chrystal, Marcey, MD  12/20/2023 [facility] MED ONC EST PT Babara Call, MD        ==========APPENDIX - ON TREATMENT VISIT NOTES==========   See weekly On Treatment Notes in Epic for details in the Media tab (listed as Progress notes on the On Treatment Visit Dates listed above).

## 2023-12-13 ENCOUNTER — Ambulatory Visit

## 2023-12-20 ENCOUNTER — Encounter: Payer: Self-pay | Admitting: Oncology

## 2023-12-20 ENCOUNTER — Inpatient Hospital Stay: Attending: Oncology | Admitting: Oncology

## 2023-12-20 VITALS — BP 140/71 | HR 117 | Temp 99.3°F | Resp 18 | Wt 220.7 lb

## 2023-12-20 DIAGNOSIS — R160 Hepatomegaly, not elsewhere classified: Secondary | ICD-10-CM | POA: Insufficient documentation

## 2023-12-20 DIAGNOSIS — D0511 Intraductal carcinoma in situ of right breast: Secondary | ICD-10-CM | POA: Insufficient documentation

## 2023-12-20 DIAGNOSIS — R42 Dizziness and giddiness: Secondary | ICD-10-CM | POA: Diagnosis not present

## 2023-12-20 DIAGNOSIS — Z923 Personal history of irradiation: Secondary | ICD-10-CM | POA: Insufficient documentation

## 2023-12-20 NOTE — Progress Notes (Signed)
 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 S/p  adjuvant radiation  Recommend  endocrine therapy with AI for 5 years. Normal  baseline DEXA. Given that she is currently undergoing work up for dizziness, shared decision was made to hold of starting of AI and re-evaluate in 2 months.   Hepatomegaly CT and ultrasound results reviewed and discussed with patient. Hepatomegaly is likely due to fatty liver disease.  Recommend regular ibuprofen. CT showed Enlarged periportal lymph nodes are presumably reactive in etiology.  Follow-up in the future.   Orders Placed This Encounter  Procedures   CBC with Differential (Cancer Center Only)    Standing Status:   Future    Expected Date:   02/20/2024    Expiration Date:   05/20/2024   CMP (Cancer Center only)    Standing Status:   Future    Expected Date:   02/20/2024    Expiration Date:   05/20/2024   Follow-up to be determined. All questions were answered. The patient knows to call the clinic with any problems, questions or concerns.  Call Babara, MD, PhD [facility] Hematology Oncology 12/20/2023   HISTORY OF PRESENTING ILLNESS:   Sherri Forbes is a  53 y.o.  female with PMH listed below was seen in consultation at the request of  Zachary Idelia LABOR, MD  for evaluation of right breast DCIS.  Oncology History  Ductal carcinoma in situ  (DCIS) of right breast  08/12/2023 Mammogram   Patient had felt a left breast nodule and underwent further workup. Bilateral diagnostic mammogram  1. There is a benign appearing intradermal mass at the site of palpable concern in the LEFT breast most consistent with an epidermal inclusion cyst. Recommend clinical management of this mass. 2. There is approximately 5 cm of indeterminate calcifications in the RIGHT breast with a dominant 17 mm group in the RIGHT lower breast at anterior depth. There is a favored sonographic correlate for these anterior calcifications at 6 o'clock in the retroareolar breast. Recommend ultrasound-guided biopsy of these calcifications at 6 o'clock followed by stereotactic guided biopsy of a second representative area of calcifications at middle depth for definitive characterization. 3. No mammographic evidence of malignancy in the LEFT breast.   09/07/2023 Initial Diagnosis   Ductal carcinoma in situ (DCIS) of right breast  Patient underwent right breast ultrasound-guided biopsy as well as stereotactic biopsy.  Pathology showed  1. 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 PATRICK REVIEWED THE CASE AND CONCURS WITH  THE INTERPRETATION.  A BREAST      PROGNOSTIC PROFILE ER 99% positive, PR 40% positive.       2. 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      - NECROSIS: SINGLE CELL NECROSIS      - CALCIFICATIONS: PRESENT      - DCIS LENGTH: MULTIFOCAL FOCI EACH APPROXIMATELY 2 MM IN GREATEST LINEAR      DIMENSION PRESENT ON PARTS 2A, 2B, 2C AND 2D   Menarche at age of 44 First live birth at age of 29 OCP use: >  5 years History of hysterectomy: no Menopausal status: Postmenopausal History of HRT use: No Number of previous breast biopsies: No    09/07/2023 Cancer Staging   Staging form: Breast, AJCC 8th Edition - Clinical stage from 09/07/2023: Stage 0 (cTis (DCIS), cN0, cM0) - Signed by Babara Call, MD on 09/07/2023 Stage prefix: Initial diagnosis   09/28/2023 Surgery   Patient is status post right lumpectomy. Pathology showed 1. Breast, lumpectomy, Right :       - DUCTAL CARCINOMA IN SITU, NUCLEAR GRADE 2, SOLID, CRIBRIFORM AND MICROPAPILLARY TYPES WITH COMEDONECROSIS, 3.3 CM (SEE SYNOPTIC REPORT).       - DCIS IS PRESENT LESS THAN 1 MM FROM THE ANTERIOR, LATERAL, AND SUPERIOR MARGINS.       - TWO BIOPSY SITES AND CLIPS PRESENT (X AND VENUS).   TUMOR Histologic Type: Ductal carcinoma in situ Size (Extent) of DCIS: Estimated size (extent) of DCIS is at least 33 mm Nuclear Grade: Grade 2 (intermediate) Necrosis: Present, central (expansive comedo necrosis) MARGINS Margin Status: All margins negative for DCIS Distance from DCIS to closest margin: Less than 1 mm Closest margins to DCIS: Anterior, superior, lateral REGIONAL LYMPH NODES Regional Lymph node Status: Not applicable (no regional lymph nodes submitted or found) DISTANT METASTASIS Distant Site(s) Involved, if applicable (select all that apply): Not applicable PATHOLOGIC STAGE CLASSIFICATION (pTNM, AJCC 8th Edition)  pTis (ductal carcinoma in situ) Regional Lymph Nodes Modifier: Not applicable pN not assigned (no nodes submitted or found) pM - Not applicable SPECIAL STUDIES Breast Biomarker Testing Performed on Previous Biopsy: [id number] - Estrogen Receptor (ER): Positive, 99%     recent history of right upper quadrant pain for about 4 to 5 days with associated nausea.  Pain is at right upper quadrant and epigastric area.  Exacerbated by eating associate with loose stools.  No fever.  Patient was evaluated by surgery Dr.  Lane and ultrasound of the abdomen and a CT scan was obtained.  Patient's pain improved after a course of ibuprofen.  S/p adjuvant Radiation.  She has felt dizziness for the past month. She has upcoming appointment with ENT for work up of vertigo.  Also she has had [facility] ER visit for SOB work up.  12/02/2023 CTA was done and showed  No pulmonary embolism.  *  Multiple bilateral pulmonary nodules measuring up to 0.5 cm are indeterminate and could be infectious/inflammatory or neoplastic in etiology. 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  *  Multistation mediastinal and bilateral hilar lymphadenopathy could be reactive or neoplastic in etiology.  *  Small pericardial effusion.  *  New prominent and mildly enlarged celiac axis and porta hepatis lymph nodes could be reactive or neoplastic in etiology   Today patient denies any  pain, fever, chills nausea vomiting.  MEDICAL HISTORY:  Past Medical History:  Diagnosis Date   Acute appendicitis with rupture 11/23/2016   Anxiety    Carpal tunnel syndrome, bilateral 04/19/2014   Chronic kidney disease    DM (diabetes mellitus), type 2 (HCC)    Ductal carcinoma in situ (DCIS) of right breast    Dyshidrotic hand dermatitis 04/19/2014   Excess ear wax 11/15/2012   History of kidney stones    Hyperlipidemia    Hypertension    Obesity    Paresthesia 04/19/2014   Renal mass    Vitamin B 12 deficiency 04/22/2014    SURGICAL HISTORY: Past Surgical History:  Procedure Laterality Date   APPENDECTOMY  11/23/2016   Dr. Nicholaus   BREAST BIOPSY Right 08/25/2023   US  RT BREAST BX W LOC DEV 1ST LESION IMG BX SPEC US  GUIDE 08/25/2023 [facility]   BREAST BIOPSY Right 08/25/2023   MM RT BREAST BX W LOC DEV 1ST LESION IMAGE BX SPEC STEREO GUIDE 08/25/2023 [facility]   BREAST BIOPSY Right 09/21/2023   MM RT BREAST SAVI/RF TAG 1ST LESION MAMMO  GUIDE 09/21/2023 [facility]   BREAST BIOPSY Right 09/21/2023   US  RT BREAST SAVI/RF TAG 1ST LESION US  GUIDE 09/21/2023 [facility]   BREAST LUMPECTOMY WITH RADIO FREQUENCY LOCALIZER Right 09/28/2023   Procedure: BREAST LUMPECTOMY WITH RADIO FREQUENCY LOCALIZER;  Surgeon: Lane Shope, MD;  Location: [facility];  Service: General;  Laterality: Right;   COLONOSCOPY WITH PROPOFOL  N/A 03/09/2021   Procedure: COLONOSCOPY WITH PROPOFOL ;  Surgeon: Onita Elspeth Sharper, DO;  Location: [facility] ENDOSCOPY;  Service: Gastroenterology;  Laterality: N/A;   KIDNEY STONE SURGERY     LAPAROSCOPIC APPENDECTOMY N/A 11/23/2016   Procedure: APPENDECTOMY LAPAROSCOPIC;  Surgeon: Nicholaus Selinda Birmingham, MD;  Location: [facility] ORS;  Service: General;  Laterality: N/A;    SOCIAL HISTORY: Social History   Socioeconomic History   Marital status: Married    Spouse name: Not on file   Number of children: Not on file   Years of education: Not on file   Highest education level: Not on file  Occupational History   Not on file  Tobacco Use   Smoking status: Former    Current packs/day: 0.00    Types: Cigarettes    Quit date: 06/05/2008    Years since quitting: 15.5   Smokeless tobacco: Never  Vaping Use   Vaping status: Never Used  Substance and Sexual Activity   Alcohol use: Yes    Comment: Social   Drug use: No   Sexual activity: Yes    Birth control/protection: None, Post-menopausal  Other Topics Concern   Not on file  Social History Narrative   Not on file   Social Drivers of Health   Financial Resource Strain: Low Risk  (09/07/2023)   Overall Financial Resource Strain (CARDIA)    Difficulty of Paying Living Expenses: Not very hard  Food Insecurity: No Food Insecurity (09/07/2023)   Hunger Vital Sign    Worried About Running Out of Food in the Last Year: Never true    Ran Out of Food in the Last Year: Never true  Transportation Needs: No Transportation Needs (09/07/2023)   PRAPARE - Therapist, Art (Medical): No    Lack of Transportation (Non-Medical): No  Physical Activity: Not on file  Stress: No Stress Concern Present (09/07/2023)   [employer] of Occupational Health - Occupational Stress Questionnaire    Feeling of Stress: Only a little  Social  Connections: Not on file  Intimate Partner Violence: Not At Risk (09/07/2023)   Humiliation, Afraid, Rape, and Kick questionnaire    Fear of Current or Ex-Partner: No    Emotionally Abused: No    Physically Abused: No    Sexually Abused: No    FAMILY HISTORY: Family History  Problem Relation Age of Onset   Hypertension Mother    Lung cancer Mother    COPD Mother    Colon cancer Father    Breast cancer Maternal Grandmother 54   Emphysema Maternal Grandmother    Melanoma Maternal Grandmother     ALLERGIES:  is allergic to penicillins and sulfa antibiotics.  MEDICATIONS:  Current Outpatient Medications  Medication Sig Dispense Refill   busPIRone (BUSPAR) 7.5 MG tablet Take 7.5 mg by mouth 2 (two) times daily.     hydrALAZINE (APRESOLINE) 50 MG tablet Take 50 mg by mouth 2 (two) times daily.     HYDROcodone -acetaminophen  (NORCO/VICODIN) 5-325 MG tablet Take 1 tablet by mouth every 6 (six) hours as needed for moderate pain (pain score 4-6). 15 tablet 0   lisinopril (ZESTRIL) 40 MG tablet Take 40 mg by mouth at bedtime.     methenamine (HIPREX) 1 g tablet Take 1 g by mouth 2 (two) times daily with a meal.     ondansetron  (ZOFRAN -ODT) 4 MG disintegrating tablet Take 1 tablet (4 mg total) by mouth every 8 (eight) hours as needed. 12 tablet 0   OZEMPIC, 0.25 OR 0.5 MG/DOSE, 2 MG/3ML SOPN Inject 0.25 mg into the skin once a week. Saturdays     pantoprazole  (PROTONIX ) 20 MG tablet TAKE 1 TABLET(20 MG) BY MOUTH DAILY 90 tablet 1   PARoxetine (PAXIL) 20 MG tablet Take 20 mg by mouth at bedtime.     simvastatin (ZOCOR) 40 MG tablet Take 40 mg by mouth at bedtime.     No current facility-administered medications  for this visit.    Review of Systems  Constitutional:  Positive for fatigue. Negative for appetite change, chills and fever.  HENT:   Negative for hearing loss and voice change.   Eyes:  Negative for eye problems.  Respiratory:  Negative for chest tightness and cough.   Cardiovascular:  Negative for chest pain.  Gastrointestinal:  Negative for abdominal distention, abdominal pain and blood in stool.  Endocrine: Negative for hot flashes.  Genitourinary:  Negative for difficulty urinating and frequency.   Musculoskeletal:  Negative for arthralgias.  Skin:  Negative for itching and rash.  Neurological:  Positive for light-headedness. Negative for extremity weakness.  Hematological:  Negative for adenopathy.  Psychiatric/Behavioral:  Negative for confusion.    PHYSICAL EXAMINATION:  Vitals:   12/20/23 1017 12/20/23 1028  BP: (!) 145/85 (!) 140/71  Pulse: (!) 117   Resp: 18   Temp: 99.3 F (37.4 C)   SpO2: 95%    Filed Weights   12/20/23 1017  Weight: 220 lb 11.2 oz (100.1 kg)    Physical Exam Constitutional:      General: She is not in acute distress. HENT:     Head: Normocephalic and atraumatic.  Eyes:     General: No scleral icterus. Cardiovascular:     Rate and Rhythm: Normal rate.  Pulmonary:     Effort: Pulmonary effort is normal. No respiratory distress.     Breath sounds: No wheezing.  Abdominal:     General: There is no distension.  Musculoskeletal:        General: Normal range of motion.  Cervical back: Normal range of motion and neck supple.  Skin:    Findings: No rash.  Neurological:     Mental Status: She is alert and oriented to person, place, and time. Mental status is at baseline.     Cranial Nerves: No cranial nerve deficit.  Psychiatric:        Mood and Affect: Mood normal.     LABORATORY DATA:  I have reviewed the data as listed    Latest Ref Rng & Units 12/01/2023    8:29 AM 11/24/2023    8:41 AM 11/15/2023   10:28 AM  CBC  WBC 4.0  - 10.5 K/uL 4.8  4.6  5.4   Hemoglobin 12.0 - 15.0 g/dL 85.7  85.8  86.0   Hematocrit 36.0 - 46.0 % 42.9  42.2  41.0   Platelets 150 - 400 K/uL 193  244  236       Latest Ref Rng & Units 11/15/2023   10:28 AM 10/11/2023   11:22 AM 09/07/2023   10:26 AM  CMP  Glucose 70 - 99 mg/dL 94  866  882   BUN 6 - 20 mg/dL 12  15  15    Creatinine 0.44 - 1.00 mg/dL 9.00  9.24  9.32   Sodium 135 - 145 mmol/L 137  138  135   Potassium 3.5 - 5.1 mmol/L 4.2  3.9  4.0   Chloride 98 - 111 mmol/L 99  102  101   CO2 22 - 32 mmol/L 23  25  23    Calcium 8.9 - 10.3 mg/dL 89.9  9.3  9.7   Total Protein 6.5 - 8.1 g/dL 8.5  8.0  8.0   Total Bilirubin 0.0 - 1.2 mg/dL 0.6  0.8  0.9   Alkaline Phos 38 - 126 U/L 70  69  70   AST 15 - 41 U/L 13  35  31   ALT 0 - 44 U/L 9  39  35       RADIOGRAPHIC STUDIES: I have personally reviewed the radiological images as listed and agreed with the findings in the report. DG Bone Density Result Date: 11/29/2023 EXAM: DUAL X-RAY ABSORPTIOMETRY (DXA) FOR BONE MINERAL DENSITY 11/29/2023 9:42 am CLINICAL DATA:  53 year old Female Postmenopausal. Breast cancer TECHNIQUE: An axial (e.g., hips, spine) and/or appendicular (e.g., radius) exam was performed, as appropriate, using GE Secretary/administrator at [facility]. Images are obtained for bone mineral density measurement and are not obtained for diagnostic purposes. [id number] Exclusions: None. COMPARISON:  None. FINDINGS: Scan quality: Good. LUMBAR SPINE (L1-L4): BMD (in g/cm2): 1.293 T-score: 0.8 Z-score: 1.6 LEFT FEMORAL NECK: BMD (in g/cm2): 1.223 T-score: 1.3 Z-score: 2.4 LEFT TOTAL HIP: BMD (in g/cm2): 1.342 T-score: 2.7 Z-score: 3.3 RIGHT FEMORAL NECK: BMD (in g/cm2): 1.275 T-score: 1.7 Z-score: 2.7 RIGHT TOTAL HIP: BMD (in g/cm2): 1.319 T-score: 2.5 Z-score: 3.1 FRAX 10-YEAR PROBABILITY OF FRACTURE: FRAX not reported as the lowest BMD is not in the osteopenia range. IMPRESSION: Normal based on BMD.  Fracture risk is not increased. RECOMMENDATIONS: 1. All patients should optimize calcium and vitamin D intake. 2. Consider FDA-approved medical therapies in postmenopausal women and men aged 61 years and older, based on the following: - A hip or vertebral (clinical or morphometric) fracture - T-score less than or equal to -2.5 and secondary causes have been excluded. - Low bone mass (T-score between -1.0 and -2.5) and a 10-year probability of a hip fracture greater than or equal  to 3% or a 10-year probability of a major osteoporosis-related fracture greater than or equal to 20% based on the [place] -adapted WHO algorithm. - Clinician judgment and/or patient preferences may indicate treatment for people with 10-year fracture probabilities above or below these levels 3. Patients with diagnosis of osteoporosis or at high risk for fracture should have regular bone mineral density tests. For patients eligible for Medicare, routine testing is allowed once every 2 years. The testing frequency can be increased to one year for patients who have rapidly progressing disease, those who are receiving or discontinuing medical therapy to restore bone mass, or have additional risk factors. Electronically Signed   By: Alm Parkins M.D.   On: 11/29/2023 11:05   CT ABDOMEN PELVIS W CONTRAST Result Date: 11/15/2023 EXAM: CT ABDOMEN AND PELVIS WITH CONTRAST 11/15/2023 12:57:53 PM TECHNIQUE: CT of the abdomen and pelvis was performed with the administration of 100 mL of iohexol  (OMNIPAQUE ) 300 MG/ML solution. Multiplanar reformatted images are provided for review. Automated exposure control, iterative reconstruction, and/or weight-based adjustment of the mA/kV was utilized to reduce the radiation dose to as low as reasonably achievable. COMPARISON: 10/17/2023 CLINICAL HISTORY: Abdominal pain, acute, nonlocalized. C/O abdominal pain radiating to right back. Patient presents from Radiation for Breast Ca. Lumpectomy to right breast September 28, 2023. Also reports fever at radiation today and rash to chest. FINDINGS: LOWER CHEST: Peripheral densities in the left lung base could reflect atelectasis or early infiltrate. LIVER: Hepatic steatosis. GALLBLADDER AND BILE DUCTS: Gallbladder is unremarkable. No biliary ductal dilatation. SPLEEN: No acute abnormality. PANCREAS: No acute abnormality. ADRENAL GLANDS: No acute abnormality. KIDNEYS, URETERS AND BLADDER: Stable calcifications in the upper pole of the left kidney. Stable low-density lesion in the lower pole of the left kidney compatible with cyst. Per consensus, no follow-up is needed for simple Bosniak type 1 and 2 renal cysts, unless the patient has a malignancy history or risk factors. No stones in the kidneys or ureters. No hydronephrosis. No perinephric or periureteral stranding. Urinary bladder is unremarkable. GI AND BOWEL: Sigmoid diverticulosis. Stomach demonstrates no acute abnormality. There is no bowel obstruction. PERITONEUM AND RETROPERITONEUM: No ascites. No free air. VASCULATURE: Aortic atherosclerosis. Aorta is normal in caliber. LYMPH NODES: No lymphadenopathy. REPRODUCTIVE ORGANS: No acute abnormality. BONES AND SOFT TISSUES: No acute osseous abnormality. No focal soft tissue abnormality. IMPRESSION: 1. No acute findings in the abdomen or pelvis. 2. Peripheral densities in the left lung base could reflect atelectasis or early infiltrate. Electronically signed by: Franky Crease MD 11/15/2023 01:39 PM EDT RP Workstation: [id number]   NM Hepato W/EjeCT Fract Result Date: 10/28/2023 EXAM: NM HEPATOBILLARY SCAN 10/26/2023 12:44:17 PM TECHNIQUE: RADIOPHARMACEUTICAL: 5.35 mCi Tc-56m mebrofenin  Anterior planar mages of the abdomen and pelvis were obtained over approximately 1 hour. Ensure plus or equivalent fatty meal was given by mouth. Dynamic imaging of the abdomen is obtained, and quantitative analysis is performed. COMPARISON: CT dated 10/17/2023. CLINICAL HISTORY: RUQ pain x 2-3 wks  sporadic. Nausea on occasion. NPO, No narcotics. Appendectomy approx 4 yrs ago. Ozempic injection every Sunday. Last inj 10/23/2023. FINDINGS: Homogenous uptake within the liver. Normal clearance of the blood pool. Appropriate excretion into the biliary system. Activity is visualized in the small bowel. Gallbladder fills. Activity is visualized in the gallbladder. Gallbladder ejection fraction measures: 47% Normal value is >38% for CCK protocol. Patient reported symptoms during infusion: None. IMPRESSION: 1. Normal gallbladder ejection fraction. 2. Patent cystic duct and common bile duct. Electronically signed by: Norleen Boxer MD 10/28/2023 11:39  AM EDT RP Workstation: [id number]   CT ABDOMEN PELVIS W CONTRAST Result Date: 10/19/2023 CLINICAL DATA:  Right upper quadrant pain. EXAM: CT ABDOMEN AND PELVIS WITH CONTRAST TECHNIQUE: Multidetector CT imaging of the abdomen and pelvis was performed using the standard protocol following bolus administration of intravenous contrast. RADIATION DOSE REDUCTION: This exam was performed according to the departmental dose-optimization program which includes automated exposure control, adjustment of the mA and/or kV according to patient size and/or use of iterative reconstruction technique. CONTRAST:  OMNIPAQUE  IOHEXOL  300 MG/ML  SOLN COMPARISON:  Ultrasound abdomen 10/11/2023 and CT abdomen pelvis 11/23/2016. FINDINGS: Lower chest: Mild scarring in the lingula and left lower lobe. Heart is at the upper limits of normal in size. Small amount of pericardial fluid may be physiologic. No pleural effusion. Distal esophagus is grossly unremarkable. Hepatobiliary: Liver is decreased in attenuation diffusely and is enlarged, 19.3 cm. Liver and gallbladder are otherwise unremarkable. No biliary ductal dilatation. Pancreas: Negative. Spleen: Negative. Adrenals/Urinary Tract: Adrenal glands and right kidney are unremarkable. Calcification in the upper pole left kidney.  Low-attenuation lesions in the left kidney. No specific follow-up necessary. Ureters are decompressed. Trace air in the bladder, presumably iatrogenic in etiology. The bladder is low in volume. Stomach/Bowel: Stomach, small bowel and appendix are unremarkable. Appendectomy. Vascular/Lymphatic: Retroaortic left renal vein. Atherosclerotic calcification of the aorta. Periportal lymph nodes measure up to 1.4 cm. Reproductive: No adnexal mass. Other: No free fluid.  Mesenteries and peritoneum are unremarkable. Musculoskeletal: Degenerative changes in the spine. IMPRESSION: 1. No acute findings. 2. Steatotic enlarged liver. Enlarged periportal lymph nodes are presumably reactive in etiology. 3. Trace air in the bladder is presumably iatrogenic in etiology. Please correlate clinically. 4.  Aortic atherosclerosis (ICD10-I70.0). Electronically Signed   By: Newell Eke M.D.   On: 10/19/2023 08:52   US  ABDOMEN LIMITED RUQ (LIVER/GB) Result Date: 10/11/2023 CLINICAL DATA:  Right upper quadrant abdominal pain. EXAM: ULTRASOUND ABDOMEN LIMITED RIGHT UPPER QUADRANT COMPARISON:  None Available. FINDINGS: Gallbladder: No gallstones, wall thickening, polyps or pericholecystic fluid visualized. Patient reportedly tender over the gallbladder at the time of sonographic imaging per sonographer but the gallbladder appears completely normal by ultrasound. Common bile duct: Diameter: 4 mm Liver: Very echogenic and heterogeneous liver parenchyma is consistent with underlying steatosis. The liver is also enlarged. No focal lesions identified by ultrasound. No intrahepatic biliary ductal dilatation. Portal vein is patent on color Doppler imaging with normal direction of blood flow towards the liver. Other: No ascites visualized in the right upper quadrant. IMPRESSION: 1. Normal appearance of the gallbladder by ultrasound. Reported sonographic tenderness overlying the gallbladder during the exam which is nonspecific given the normal  sonographic appearance of the gallbladder and bile ducts by ultrasound. 2. Hepatomegaly and hepatic steatosis. Electronically Signed   By: Marcey Moan M.D.   On: 10/11/2023 11:56   MM Breast Surgical Specimen Result Date: 09/28/2023 CLINICAL DATA:  Status post [facility] localized RIGHT breast lumpectomy. EXAM: SPECIMEN RADIOGRAPH OF THE RIGHT BREAST COMPARISON:  Previous exam(s). FINDINGS: Status post excision of the RIGHT breast. The 2 [employer] reflectors, X shaped and venus shaped clip are present within the specimen. IMPRESSION: Specimen radiograph of the RIGHT breast. Electronically Signed   By: Corean Salter M.D.   On: 09/28/2023 14:26   US  RT BREAST SAVY/RF TAG 1ST LESION US  GUIDE Result Date: 09/21/2023 CLINICAL DATA:  RIGHT breast DCIS spanning approximately 5 cm, with anterior extent biopsied with Venus clip and posterior extent biopsied with X clip  EXAM: NEEDLE LOCALIZATION OF THE RIGHT BREAST WITH ULTRASOUND GUIDANCE; NEEDLE LOCALIZATION OF THE RIGHT BREAST WITH MAMMOGRAPHIC GUIDANCE COMPARISON:  Previous exam(s). 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 I met with the patient and we discussed the procedure of needle localization including benefits and alternatives. We discussed the high likelihood of a successful procedure. We discussed the risks of the procedure, including infection, bleeding, tissue injury, and further surgery. Informed, written consent was given. The usual time-out protocol was performed immediately prior to the procedure. SITE 1: Anterior extent, Venus clip, ultrasound-guided Using ultrasound guidance, sterile technique, 1% lidocaine  and a 10 cm SAVI SCOUT needle, the anterior extent of the RIGHT breast calcifications was localized using a inferior approach. SITE 2: Posterior extent, X clip, mammogram guided Using mammographic guidance, sterile technique, 1% lidocaine  and a 10 cm SAVI  SCOUT needle, the posterior extent of the RIGHT breast calcifications was localized using a LATERAL approach. Residual calcifications largely extend medially from the biopsy sites. The images were marked for Dr. Lane. IMPRESSION: Radar reflector localization of the RIGHT breast. Residual calcifications are present at the biopsy sites and extending medially. No apparent complications. Electronically Signed   By: Norleen Croak M.D.   On: 09/21/2023 15:25   MM RT BREAST SAVI/RF TAG 1ST LESION MAMMO GUIDE Result Date: 09/21/2023 CLINICAL DATA:  RIGHT breast DCIS spanning approximately 5 cm, with anterior extent biopsied with Venus clip and posterior extent biopsied with X clip EXAM: NEEDLE LOCALIZATION OF THE RIGHT BREAST WITH ULTRASOUND GUIDANCE; NEEDLE LOCALIZATION OF THE RIGHT BREAST WITH MAMMOGRAPHIC GUIDANCE COMPARISON:  Previous exam(s). 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 I met with the patient and we discussed the procedure of needle localization including benefits and alternatives. We discussed the high likelihood of a successful procedure. We discussed the risks of the procedure, including infection, bleeding, tissue injury, and further surgery. Informed, written consent was given. The usual time-out protocol was performed immediately prior to the procedure. SITE 1: Anterior extent, Venus clip, ultrasound-guided Using ultrasound guidance, sterile technique, 1% lidocaine  and a 10 cm SAVI SCOUT needle, the anterior extent of the RIGHT breast calcifications was localized using a inferior approach. SITE 2: Posterior extent, X clip, mammogram guided Using mammographic guidance, sterile technique, 1% lidocaine  and a 10 cm SAVI SCOUT needle, the posterior extent of the RIGHT breast calcifications was localized using a LATERAL approach. Residual calcifications largely extend medially from the biopsy sites. The images were  marked for Dr. Lane. IMPRESSION: Radar reflector localization of the RIGHT breast. Residual calcifications are present at the biopsy sites and extending medially. No apparent complications. Electronically Signed   By: Norleen Croak M.D.   On: 09/21/2023 15:25

## 2023-12-20 NOTE — Assessment & Plan Note (Signed)
 CT and ultrasound results reviewed and discussed with patient. Hepatomegaly is likely due to fatty liver disease.  Recommend regular ibuprofen. CT showed Enlarged periportal lymph nodes are presumably reactive in etiology.  Follow-up in the future.

## 2023-12-20 NOTE — Assessment & Plan Note (Addendum)
 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 S/p  adjuvant radiation  Recommend  endocrine therapy with AI for 5 years. Normal  baseline DEXA. Given that she is currently undergoing work up for dizziness, shared decision was made to hold of starting of AI and re-evaluate in 2 months.

## 2024-01-02 ENCOUNTER — Emergency Department
Admission: EM | Admit: 2024-01-02 | Discharge: 2024-01-02 | Disposition: A | Discharge status: Home / Self Care | Attending: Emergency Medicine | Admitting: Emergency Medicine

## 2024-01-02 ENCOUNTER — Other Ambulatory Visit: Payer: Self-pay

## 2024-01-02 DIAGNOSIS — I10 Essential (primary) hypertension: Secondary | ICD-10-CM | POA: Diagnosis present

## 2024-01-02 NOTE — Discharge Instructions (Signed)
 Call your regular doctor to discuss switching your medication since your reading was so high at the beginning of the morning.  Your blood pressure at discharge is 148/76.  Therefore do not feel like we need to do any additional workup for you.  Return emergency department if you are worsening

## 2024-01-02 NOTE — ED Provider Notes (Signed)
 [facility] Provider Note    Event Date/Time   First MD Initiated Contact with Patient 01/02/24 [phone]     (approximate)   History   Hypertension   HPI  Sherri Forbes is a 53 y.o. female with history of pretension, vertigo presents emergency department complaining of elevated blood pressure.  Patient called the ENT to get a another maneuver to help with her vertigo when they noticed that her blood pressure was registering in the 200s systolic.  Patient states she had just taken her blood pressure medication prior to going to the appointment.  States they sent her here because of the level of the blood pressure.  Denies chest pain, shortness of breath, headache      Physical Exam   Triage Vital Signs: ED Triage Vitals [01/02/24 0858]  Encounter Vitals Group     BP (!) 160/91     Girls Systolic BP Percentile      Girls Diastolic BP Percentile      Boys Systolic BP Percentile      Boys Diastolic BP Percentile      Pulse Rate (!) 118     Resp 18     Temp 98 F (36.7 C)     Temp src      SpO2 94 %     Weight      Height      Head Circumference      Peak Flow      Pain Score 0     Pain Loc      Pain Education      Exclude from Growth Chart     Most recent vital signs: Vitals:   01/02/24 0858  BP: (!) 160/91  Pulse: (!) 118  Resp: 18  Temp: 98 F (36.7 C)  SpO2: 94%     General: Awake, no distress.   CV:  Good peripheral perfusion.  Resp:  Normal effort.  Abd:  No distention.   Other:     ED Results / Procedures / Treatments   Labs (all labs ordered are listed, but only abnormal results are displayed) Labs Reviewed - No data to display   EKG     RADIOLOGY     PROCEDURES:   Procedures  Critical Care:  no Chief Complaint  Patient presents with   Hypertension      MEDICATIONS ORDERED IN ED: Medications - No data to display   IMPRESSION / MDM / ASSESSMENT AND PLAN / ED COURSE  I reviewed the triage  vital signs and the nursing notes.                              Differential diagnosis includes, but is not limited to, hypertension, malignant hypertension, hypertensive crisis  Patient's presentation is most consistent with acute illness / injury with system symptoms.   Patient's blood pressure had decreased to 160/91 when she arrived here to the ED.  I do not feel this is a hypertensive crisis or emergency at this time.  Patient has no symptoms such as chest pain, shortness of breath or headache.  I do feel that her blood pressure medication had just not taken effect when she was at the ENT office.  Also encouraged her to let them know she needs to be in a large cuff when getting her blood pressure taken.  She should also call her regular doctor to address whether or not they  should change her medication.  At discharge patient's blood pressure was taken by me, had decreased to 148/71.  Therefore patient's not got any red flags to warrant further workup.  She is in agreement with this treatment plan.  Discharged stable condition.      FINAL CLINICAL IMPRESSION(S) / ED DIAGNOSES   Final diagnoses:  Primary hypertension     Rx / DC Orders   ED Discharge Orders     None        Note:  This document was prepared using Dragon voice recognition software and may include unintentional dictation errors.    Gasper Devere ORN, PA-C 01/02/24 1136    Floy Roberts, MD 01/02/24 [phone]

## 2024-01-02 NOTE — ED Triage Notes (Signed)
 Pt comes with c/o HTN. Pt states she went to her ENT for her vertigo and her BP was in 200s. They told her to come here. Pt does take bp meds and took 1 dose already today.  Pt denies any other symptoms.

## 2024-01-16 ENCOUNTER — Ambulatory Visit
Admission: RE | Admit: 2024-01-16 | Discharge: 2024-01-16 | Disposition: A | Discharge status: Home / Self Care | Source: Ambulatory Visit | Attending: Radiation Oncology | Admitting: Radiation Oncology

## 2024-01-16 ENCOUNTER — Encounter: Payer: Self-pay | Admitting: Radiation Oncology

## 2024-01-16 VITALS — BP 147/89 | HR 106 | Temp 98.8°F | Resp 20 | Wt 219.0 lb

## 2024-01-16 DIAGNOSIS — R42 Dizziness and giddiness: Secondary | ICD-10-CM | POA: Diagnosis not present

## 2024-01-16 DIAGNOSIS — Z923 Personal history of irradiation: Secondary | ICD-10-CM | POA: Insufficient documentation

## 2024-01-16 DIAGNOSIS — D0511 Intraductal carcinoma in situ of right breast: Secondary | ICD-10-CM | POA: Insufficient documentation

## 2024-01-16 NOTE — Progress Notes (Signed)
 Radiation Oncology Follow up Note  Name: Sherri Forbes   Date:   01/16/2024 MRN:  [id number] DOB: [birth year]    This 53 y.o. 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  REFERRING PROVIDER: Zachary Idelia LABOR, MD  HPI: Patient is a 53 year old female now out 1 month having completed whole breast radiation to her right breast for ER-positive ductal carcinoma in situ.  Seen today in routine follow-up she is doing well.  She specifically denies breast tenderness cough or bone pain.  She has developed some vertigo which is being worked up by ENT.  Her endocrine therapy is currently on hold until that issue is resolved.  COMPLICATIONS OF TREATMENT: none  FOLLOW UP COMPLIANCE: keeps appointments   PHYSICAL EXAM:  BP (!) 147/89   Pulse (!) 106   Temp 98.8 F (37.1 C) (Tympanic)   Resp 20   Wt 219 lb (99.3 kg)   LMP 07/07/2016   BMI 37.59 kg/m  Lungs are clear to A&P cardiac examination essentially unremarkable with regular rate and rhythm. No dominant mass or nodularity is noted in either breast in 2 positions examined. Incision is well-healed. No axillary or supraclavicular adenopathy is appreciated. Cosmetic result is excellent.  Well-developed well-nourished patient in NAD. HEENT reveals PERLA, EOMI, discs not visualized.  Oral cavity is clear. No oral mucosal lesions are identified. Neck is clear without evidence of cervical or supraclavicular adenopathy. Lungs are clear to A&P. Cardiac examination is essentially unremarkable with regular rate and rhythm without murmur rub or thrill. Abdomen is benign with no organomegaly or masses noted. Motor sensory and DTR levels are equal and symmetric in the upper and lower extremities. Cranial nerves II through XII are grossly intact. Proprioception is intact. No peripheral adenopathy or edema is identified. No motor or sensory levels are  noted. Crude visual fields are within normal range.  RADIOLOGY RESULTS: No current films for review  PLAN: Present time patient is doing well 1 month out from whole breast radiation and pleased with her overall progress.  She is having issues with vertigo and is being addressed by ENT.  She is also currently on hold for endocrine therapy although that is continued to be discussed with medical oncology.  I have asked to see her back in 6 months for follow-up.  Patient knows to call with any concerns.  I would like to take this opportunity to thank you for allowing me to participate in the care of your patient.SABRA Marcey Penton, MD

## 2024-01-25 ENCOUNTER — Ambulatory Visit: Admitting: Family Medicine

## 2024-03-01 ENCOUNTER — Inpatient Hospital Stay: Admitting: Oncology

## 2024-03-01 ENCOUNTER — Inpatient Hospital Stay

## 2024-03-14 ENCOUNTER — Ambulatory Visit: Admitting: Family Medicine

## 2024-07-16 ENCOUNTER — Ambulatory Visit: Admitting: Radiation Oncology
